# Patient Record
Sex: Female | Born: 1980 | Race: Black or African American | Hispanic: No | Marital: Single | Smoking: Current every day smoker
Health system: Southern US, Community
[De-identification: ages and names within clinical notes are randomized; demographics above are authoritative.]

## PROBLEM LIST (undated history)

## (undated) DIAGNOSIS — Z6841 Body Mass Index (BMI) 40.0 and over, adult: Secondary | ICD-10-CM

## (undated) DIAGNOSIS — I10 Essential (primary) hypertension: Secondary | ICD-10-CM

## (undated) DIAGNOSIS — Z86718 Personal history of other venous thrombosis and embolism: Secondary | ICD-10-CM

## (undated) DIAGNOSIS — K219 Gastro-esophageal reflux disease without esophagitis: Secondary | ICD-10-CM

## (undated) DIAGNOSIS — E669 Obesity, unspecified: Secondary | ICD-10-CM

## (undated) DIAGNOSIS — G4700 Insomnia, unspecified: Secondary | ICD-10-CM

## (undated) DIAGNOSIS — G4730 Sleep apnea, unspecified: Secondary | ICD-10-CM

## (undated) DIAGNOSIS — R296 Repeated falls: Secondary | ICD-10-CM

## (undated) DIAGNOSIS — G629 Polyneuropathy, unspecified: Secondary | ICD-10-CM

## (undated) DIAGNOSIS — G43909 Migraine, unspecified, not intractable, without status migrainosus: Secondary | ICD-10-CM

## (undated) DIAGNOSIS — F1010 Alcohol abuse, uncomplicated: Secondary | ICD-10-CM

## (undated) DIAGNOSIS — R32 Unspecified urinary incontinence: Secondary | ICD-10-CM

## (undated) DIAGNOSIS — F17218 Nicotine dependence, cigarettes, with other nicotine-induced disorders: Secondary | ICD-10-CM

## (undated) DIAGNOSIS — F32A Depression, unspecified: Secondary | ICD-10-CM

## (undated) DIAGNOSIS — R269 Unspecified abnormalities of gait and mobility: Secondary | ICD-10-CM

## (undated) DIAGNOSIS — R0609 Other forms of dyspnea: Secondary | ICD-10-CM

## (undated) HISTORY — DX: Unspecified urinary incontinence: R32

## (undated) HISTORY — DX: Repeated falls: R29.6

## (undated) HISTORY — DX: Other forms of dyspnea: R06.09

## (undated) HISTORY — DX: Alcohol abuse, uncomplicated: F10.10

## (undated) HISTORY — DX: Insomnia, unspecified: G47.00

## (undated) HISTORY — DX: Personal history of other venous thrombosis and embolism: Z86.718

## (undated) HISTORY — DX: Body Mass Index (BMI) 40.0 and over, adult: Z684

## (undated) HISTORY — DX: Nicotine dependence, cigarettes, with other nicotine-induced disorders: F17.218

## (undated) HISTORY — DX: Gastro-esophageal reflux disease without esophagitis: K21.9

## (undated) HISTORY — DX: Essential (primary) hypertension: I10

## (undated) HISTORY — DX: Unspecified abnormalities of gait and mobility: R26.9

## (undated) HISTORY — PX: FINGER FRACTURE SURGERY: SHX638

## (undated) HISTORY — DX: Sleep apnea, unspecified: G47.30

## (undated) HISTORY — DX: Depression, unspecified: F32.A

## (undated) HISTORY — DX: Morbid (severe) obesity due to excess calories: E66.01

---

## 1998-05-12 ENCOUNTER — Other Ambulatory Visit: Admission: RE | Admit: 1998-05-12 | Discharge: 1998-05-12 | Payer: Self-pay | Admitting: *Deleted

## 1998-05-20 ENCOUNTER — Ambulatory Visit (HOSPITAL_COMMUNITY): Admission: RE | Admit: 1998-05-20 | Discharge: 1998-05-20 | Payer: Self-pay | Admitting: Obstetrics

## 1998-06-11 ENCOUNTER — Ambulatory Visit (HOSPITAL_COMMUNITY): Admission: RE | Admit: 1998-06-11 | Discharge: 1998-06-11 | Payer: Self-pay

## 1998-08-07 ENCOUNTER — Inpatient Hospital Stay (HOSPITAL_COMMUNITY): Admission: AD | Admit: 1998-08-07 | Discharge: 1998-08-07 | Payer: Self-pay | Admitting: *Deleted

## 1998-08-08 ENCOUNTER — Inpatient Hospital Stay (HOSPITAL_COMMUNITY): Admission: AD | Admit: 1998-08-08 | Discharge: 1998-08-10 | Payer: Self-pay | Admitting: *Deleted

## 1999-04-07 ENCOUNTER — Emergency Department (HOSPITAL_COMMUNITY): Admission: EM | Admit: 1999-04-07 | Discharge: 1999-04-07 | Payer: Self-pay | Admitting: Emergency Medicine

## 2001-02-15 ENCOUNTER — Inpatient Hospital Stay (HOSPITAL_COMMUNITY): Admission: AD | Admit: 2001-02-15 | Discharge: 2001-02-15 | Payer: Self-pay | Admitting: Obstetrics & Gynecology

## 2001-12-18 ENCOUNTER — Emergency Department (HOSPITAL_COMMUNITY): Admission: EM | Admit: 2001-12-18 | Discharge: 2001-12-18 | Payer: Self-pay | Admitting: *Deleted

## 2001-12-18 ENCOUNTER — Encounter: Payer: Self-pay | Admitting: Emergency Medicine

## 2002-04-03 ENCOUNTER — Encounter: Payer: Self-pay | Admitting: Emergency Medicine

## 2002-04-03 ENCOUNTER — Emergency Department (HOSPITAL_COMMUNITY): Admission: EM | Admit: 2002-04-03 | Discharge: 2002-04-03 | Payer: Self-pay | Admitting: Emergency Medicine

## 2002-08-11 ENCOUNTER — Emergency Department (HOSPITAL_COMMUNITY): Admission: EM | Admit: 2002-08-11 | Discharge: 2002-08-11 | Payer: Self-pay | Admitting: Emergency Medicine

## 2003-02-06 ENCOUNTER — Emergency Department (HOSPITAL_COMMUNITY): Admission: EM | Admit: 2003-02-06 | Discharge: 2003-02-06 | Payer: Self-pay | Admitting: *Deleted

## 2003-03-10 ENCOUNTER — Encounter: Admission: RE | Admit: 2003-03-10 | Discharge: 2003-03-10 | Payer: Self-pay | Admitting: Obstetrics & Gynecology

## 2003-03-10 ENCOUNTER — Encounter: Payer: Self-pay | Admitting: Obstetrics & Gynecology

## 2005-04-09 ENCOUNTER — Emergency Department (HOSPITAL_COMMUNITY): Admission: EM | Admit: 2005-04-09 | Discharge: 2005-04-09 | Payer: Self-pay | Admitting: Emergency Medicine

## 2005-05-19 ENCOUNTER — Emergency Department (HOSPITAL_COMMUNITY): Admission: EM | Admit: 2005-05-19 | Discharge: 2005-05-19 | Payer: Self-pay | Admitting: Emergency Medicine

## 2006-11-02 ENCOUNTER — Ambulatory Visit (HOSPITAL_COMMUNITY): Admission: RE | Admit: 2006-11-02 | Discharge: 2006-11-02 | Payer: Self-pay | Admitting: Obstetrics

## 2006-12-18 HISTORY — PX: ESSURE TUBAL LIGATION: SUR464

## 2006-12-19 ENCOUNTER — Ambulatory Visit (HOSPITAL_COMMUNITY): Admission: RE | Admit: 2006-12-19 | Discharge: 2006-12-19 | Payer: Self-pay | Admitting: Obstetrics

## 2007-01-09 ENCOUNTER — Ambulatory Visit (HOSPITAL_COMMUNITY): Admission: RE | Admit: 2007-01-09 | Discharge: 2007-01-09 | Payer: Self-pay | Admitting: Obstetrics

## 2007-03-21 ENCOUNTER — Ambulatory Visit (HOSPITAL_COMMUNITY): Admission: RE | Admit: 2007-03-21 | Discharge: 2007-03-21 | Payer: Self-pay | Admitting: Obstetrics

## 2007-05-20 ENCOUNTER — Inpatient Hospital Stay (HOSPITAL_COMMUNITY): Admission: RE | Admit: 2007-05-20 | Discharge: 2007-05-22 | Payer: Self-pay | Admitting: Obstetrics

## 2007-10-20 ENCOUNTER — Emergency Department (HOSPITAL_COMMUNITY): Admission: EM | Admit: 2007-10-20 | Discharge: 2007-10-20 | Payer: Self-pay | Admitting: Family Medicine

## 2008-02-18 ENCOUNTER — Ambulatory Visit (HOSPITAL_COMMUNITY): Admission: RE | Admit: 2008-02-18 | Discharge: 2008-02-18 | Payer: Self-pay | Admitting: Obstetrics & Gynecology

## 2009-05-19 ENCOUNTER — Emergency Department (HOSPITAL_COMMUNITY): Admission: EM | Admit: 2009-05-19 | Discharge: 2009-05-19 | Payer: Self-pay | Admitting: Family Medicine

## 2011-01-09 ENCOUNTER — Encounter: Payer: Self-pay | Admitting: Obstetrics & Gynecology

## 2011-05-02 NOTE — Op Note (Signed)
NAME:  Sonya Hurst, Sonya Hurst             ACCOUNT NO.:  [id number]   MEDICAL RECORD NO.:  [id number]          PATIENT TYPE:  AMB   LOCATION:  SDC                           FACILITY:  [facility]   PHYSICIAN:  Roseanna Rainbow, M.D.DATE OF BIRTH:  1981/08/24   DATE OF PROCEDURE:  02/18/2008  DATE OF DISCHARGE:                               OPERATIVE REPORT   PREOPERATIVE DIAGNOSIS:  Desires sterilization procedure.   POSTOPERATIVE DIAGNOSIS:  Desires sterilization procedure.   PROCEDURE:  Essure placement.   SURGEON:  Antionette Char, M. D.   ANESTHESIA:  Laryngeal mask airway.   PATHOLOGY:  Not applicable.   ESTIMATED BLOOD LOSS:  Minimal.   COMPLICATIONS:  None.   IV FLUIDS:  As per anesthesiology.   URINE OUTPUT:  200 mL clear urine at the beginning of the procedure.   PROCEDURE:  The patient was taken to the operating room with an IV  running.  A laryngeal mask airway was placed without difficulty.  She  was placed in the dorsal lithotomy position and prepped and draped in  the usual sterile fashion.  After a time-out had been completed a Graves  speculum was then placed in the patient's vagina.  The anterior lip of  the cervix was then grasped with a single-tooth tenaculum.  The cervix  was then dilated with [facility] dilators.  The hysteroscope was then  advanced into the uterus and the tubal ostia were visualized by a  bilaterally.  The left tube was cannulated first and Essure was then  placed using the introducer.  There were seven coils distal to the tubal  ostia after placement.  The procedure was repeated on the right and two  coils were noted distal from the tubal ostia after insertion.  The  hysteroscope was then removed, the single-tooth tenaculum was then  removed with minimal bleeding noted from the cervix.  At the close of  procedure the instrument and pack counts were said to be correct x2.  The patient was taken to the PACU awake and in stable condition.      Roseanna Rainbow, M.D.  Electronically Signed     LAJ/MEDQ  D:  02/18/2008  T:  02/18/2008  Job:  [id number]

## 2011-09-11 LAB — CBC
HCT: 40.7
Hemoglobin: 13.5
MCHC: 33.2
RDW: 14.1

## 2011-10-05 LAB — CBC
HCT: 33.4 — ABNORMAL LOW
Hemoglobin: 11.2 — ABNORMAL LOW
Hemoglobin: 12.4
MCHC: 33.6
MCHC: 34.1
MCV: 90.3
MCV: 91.2
RBC: 3.66 — ABNORMAL LOW
RBC: 4.02
RDW: 14.2 — ABNORMAL HIGH
RDW: 14.4 — ABNORMAL HIGH

## 2011-10-05 LAB — RPR: RPR Ser Ql: NONREACTIVE

## 2014-01-30 ENCOUNTER — Encounter (HOSPITAL_COMMUNITY): Payer: Self-pay | Admitting: Emergency Medicine

## 2014-01-30 ENCOUNTER — Emergency Department (INDEPENDENT_AMBULATORY_CARE_PROVIDER_SITE_OTHER): Payer: 59

## 2014-01-30 ENCOUNTER — Emergency Department (HOSPITAL_COMMUNITY)
Admission: EM | Admit: 2014-01-30 | Discharge: 2014-01-30 | Disposition: A | Discharge status: Home / Self Care | Payer: 59 | Source: Home / Self Care

## 2014-01-30 DIAGNOSIS — S62609A Fracture of unspecified phalanx of unspecified finger, initial encounter for closed fracture: Secondary | ICD-10-CM

## 2014-01-30 MED ORDER — TRAMADOL HCL 50 MG PO TABS: 50.0000 mg | ORAL_TABLET | Freq: Four times a day (QID) | ORAL | Status: DC | PRN

## 2014-01-30 NOTE — Discharge Instructions (Signed)
Thank you for coming in today. Keep the finger splinted.  Use 2 aleve twice daily for pain as needed.  Use tramadol for severe pain.  Follow up with your hand surgeon.   Finger Fracture Fractures of fingers are breaks in the bones of the fingers. There are many types of fractures. There are different ways of treating these fractures. Your health care provider will discuss the best way to treat your fracture. CAUSES Traumatic injury is the main cause of broken fingers. These include:  Injuries while playing sports.  Workplace injuries.  Falls. RISK FACTORS Activities that can increase your risk of finger fractures include:  Sports.  Workplace activities that involve machinery.  A condition called osteoporosis, which can make your bones less dense and cause them to fracture more easily. SIGNS AND SYMPTOMS The main symptoms of a broken finger are pain and swelling within 15 minutes after the injury. Other symptoms include:  Bruising of your finger.  Stiffness of your finger.  Numbness of your finger.  Exposed bones (compound fracture) if the fracture is severe. DIAGNOSIS  The best way to diagnose a broken bone is with X-ray imaging. Additionally, your health care provider will use this X-ray image to evaluate the position of the broken finger bones.  TREATMENT  Finger fractures can be treated with:   Nonreduction This means the bones are in place. The finger is splinted without changing the positions of the bone pieces. The splint is usually left on for about a week to 10 days. This will depend on your fracture and what your health care provider thinks.  Closed reduction The bones are put back into position without using surgery. The finger is then splinted.  Open reduction and internal fixation The fracture site is opened. Then the bone pieces are fixed into place with pins or some type of hardware. This is seldom required. It depends on the severity of the fracture. HOME  CARE INSTRUCTIONS   Follow your health care provider's instructions regarding activities, exercises, and physical therapy.  Only take over-the-counter or prescription medicines for pain, discomfort, or fever as directed by your health care provider. SEEK MEDICAL CARE IF: You have pain or swelling that limits the motion or use of your fingers. SEEK IMMEDIATE MEDICAL CARE IF:  Your finger becomes numb. MAKE SURE YOU:   Understand these instructions.  Will watch your condition.  Will get help right away if you are not doing well or get worse. Document Released: 03/18/2001 Document Revised: 09/24/2013 Document Reviewed: 07/16/2013 [facility] Patient Information 2014 [place], [place].
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
## 2015-04-12 ENCOUNTER — Encounter (HOSPITAL_COMMUNITY): Payer: Self-pay | Admitting: *Deleted

## 2015-04-12 ENCOUNTER — Emergency Department (HOSPITAL_COMMUNITY)
Admission: EM | Admit: 2015-04-12 | Discharge: 2015-04-12 | Discharge status: Against Medical Advice | Payer: PRIVATE HEALTH INSURANCE | Attending: Emergency Medicine | Admitting: Emergency Medicine

## 2015-04-12 ENCOUNTER — Encounter (HOSPITAL_COMMUNITY): Payer: Self-pay | Admitting: Emergency Medicine

## 2015-04-12 ENCOUNTER — Emergency Department (INDEPENDENT_AMBULATORY_CARE_PROVIDER_SITE_OTHER)
Admission: EM | Admit: 2015-04-12 | Discharge: 2015-04-12 | Disposition: A | Discharge status: Nursing Home (Includes ICF) | Payer: PRIVATE HEALTH INSURANCE | Source: Home / Self Care | Attending: Family Medicine | Admitting: Family Medicine

## 2015-04-12 ENCOUNTER — Emergency Department (INDEPENDENT_AMBULATORY_CARE_PROVIDER_SITE_OTHER): Payer: PRIVATE HEALTH INSURANCE

## 2015-04-12 DIAGNOSIS — E669 Obesity, unspecified: Secondary | ICD-10-CM | POA: Diagnosis not present

## 2015-04-12 DIAGNOSIS — R2240 Localized swelling, mass and lump, unspecified lower limb: Secondary | ICD-10-CM | POA: Diagnosis present

## 2015-04-12 DIAGNOSIS — R0602 Shortness of breath: Secondary | ICD-10-CM | POA: Diagnosis not present

## 2015-04-12 DIAGNOSIS — Z720 Tobacco use: Secondary | ICD-10-CM | POA: Insufficient documentation

## 2015-04-12 HISTORY — DX: Obesity, unspecified: E66.9

## 2015-04-12 HISTORY — DX: Migraine, unspecified, not intractable, without status migrainosus: G43.909

## 2015-04-12 LAB — POCT I-STAT, CHEM 8
CHLORIDE: 105 mmol/L (ref 96–112)
Calcium, Ion: 1.1 mmol/L — ABNORMAL LOW (ref 1.12–1.23)
Creatinine, Ser: 0.8 mg/dL (ref 0.50–1.10)
Glucose, Bld: 97 mg/dL (ref 70–99)
HCT: 47 % — ABNORMAL HIGH (ref 36.0–46.0)
Hemoglobin: 16 g/dL — ABNORMAL HIGH (ref 12.0–15.0)
Potassium: 3.4 mmol/L — ABNORMAL LOW (ref 3.5–5.1)
SODIUM: 140 mmol/L (ref 135–145)
TCO2: 19 mmol/L (ref 0–100)

## 2015-04-12 LAB — POCT URINALYSIS DIP (DEVICE)
Glucose, UA: NEGATIVE mg/dL
LEUKOCYTES UA: NEGATIVE
Nitrite: NEGATIVE
PROTEIN: 30 mg/dL — AB
Specific Gravity, Urine: >=1.03 (ref 1.005–1.030)
UROBILINOGEN UA: 1 mg/dL (ref 0.0–1.0)
pH: 5 (ref 5.0–8.0)

## 2015-04-12 NOTE — ED Provider Notes (Addendum)
CSN: [id number]     Arrival date & time 04/12/15  1330 History   First MD Initiated Contact with Patient 04/12/15 1403     Chief Complaint  Patient presents with  . Leg Swelling  . Shortness of Breath  . Galactorrhea   (Consider location/radiation/quality/duration/timing/severity/associated sxs/prior Treatment) Patient is a 34 y.o. female presenting with shortness of breath. The history is provided by the patient.  Shortness of Breath Severity:  Moderate Onset quality:  Gradual Duration:  2 days Progression:  Unchanged Chronicity:  New Context: activity   Relieved by:  None tried Worsened by:  Nothing tried Ineffective treatments:  None tried Associated symptoms: no chest pain and no fever   Risk factors: alcohol use, obesity and tobacco use     Past Medical History  Diagnosis Date  . Migraines    History reviewed. No pertinent past surgical history. History reviewed. No pertinent family history. History  Substance Use Topics  . Smoking status: Never Smoker   . Smokeless tobacco: Not on file  . Alcohol Use: Yes   OB History    No data available     Review of Systems  Constitutional: Negative.  Negative for fever.  HENT: Negative.   Respiratory: Positive for shortness of breath.   Cardiovascular: Positive for leg swelling. Negative for chest pain and palpitations.    Allergies  Review of patient's allergies indicates no known allergies.  Home Medications   Prior to Admission medications   Medication Sig Start Date End Date Taking? Authorizing Provider  traMADol (ULTRAM) 50 MG tablet Take 1 tablet (50 mg total) by mouth every 6 (six) hours as needed. 01/30/14   Rodolph BongEvan S Corey, MD   BP 133/86 mmHg  Pulse 112  Temp(Src) 99.4 F (37.4 C) (Oral)  Resp 16  SpO2 97% Physical Exam  Constitutional: She is oriented to person, place, and time. She appears well-developed and well-nourished. She is cooperative.  HENT:  Head: Normocephalic.  Mouth/Throat: Oropharynx  is clear and moist.  Eyes: Conjunctivae and EOM are normal. Pupils are equal, round, and reactive to light.  Neck: Normal range of motion. Neck supple.  Cardiovascular: Regular rhythm, normal heart sounds and intact distal pulses.  Tachycardia present.   No murmur heard. Pulmonary/Chest: Effort normal and breath sounds normal.  Abdominal: Soft. Bowel sounds are normal. She exhibits no mass. There is no tenderness.  Musculoskeletal: She exhibits edema. She exhibits no tenderness.  Lymphadenopathy:    She has no cervical adenopathy.  Neurological: She is alert and oriented to person, place, and time.  Nursing note and vitals reviewed.   ED Course  Procedures (including critical care time) Labs Review Labs Reviewed  POCT URINALYSIS DIP (DEVICE) - Abnormal; Notable for the following:    Bilirubin Urine SMALL (*)    Ketones, ur TRACE (*)    Hgb urine dipstick SMALL (*)    Protein, ur 30 (*)    All other components within normal limits  POCT I-STAT, CHEM 8 - Abnormal; Notable for the following:    Potassium 3.4 (*)    BUN <3 (*)    Calcium, Ion 1.10 (*)    Hemoglobin 16.0 (*)    HCT 47.0 (*)    All other components within normal limits    Imaging Review Dg Chest 2 View  04/12/2015   CLINICAL DATA:  Shortness of Breath  EXAM: CHEST  2 VIEW  COMPARISON:  May 19, 2005  FINDINGS: Lungs are clear. Heart size and pulmonary vascularity are  normal. No adenopathy. No bone lesions.  IMPRESSION: No edema or consolidation.   Electronically Signed   By: Bretta Bang III M.D.   On: 04/12/2015 14:44     MDM   1. SOB (shortness of breath) on exertion    Sent for eval of sob, tachycardia, doe, edema unclear etiol. Alcohol abuse.    Linna Hoff, MD 04/12/15 1516  Linna Hoff, MD 04/12/15 [phone]

## 2015-04-12 NOTE — ED Notes (Addendum)
Pt states she has been suffering from BLE swelling for about two weeks.  She also reports SOB with any activity and when lying down, and numbness in BUE.  Pt also reports urinary leakage. Pt admits to about a bottle of alcohol a day, depending on how angry her job makes her.

## 2015-04-12 NOTE — ED Notes (Signed)
Pt sent here from ucc. Reports leg swelling x 2 weeks. ucc reported sob with exertion, pt states that is normal for her. No resp distress noted at triage.

## 2015-04-12 NOTE — ED Notes (Signed)
Pt being transferred to ED via shuttle.  Report was given to the ED First Nurse.

## 2015-08-26 ENCOUNTER — Encounter (HOSPITAL_COMMUNITY): Payer: Self-pay | Admitting: Emergency Medicine

## 2015-08-26 ENCOUNTER — Encounter (HOSPITAL_COMMUNITY): Payer: Self-pay | Admitting: *Deleted

## 2015-08-26 ENCOUNTER — Emergency Department (HOSPITAL_COMMUNITY)
Admission: EM | Admit: 2015-08-26 | Discharge: 2015-08-26 | Disposition: A | Discharge status: Home / Self Care | Payer: No Typology Code available for payment source | Attending: Emergency Medicine | Admitting: Emergency Medicine

## 2015-08-26 DIAGNOSIS — L03311 Cellulitis of abdominal wall: Secondary | ICD-10-CM | POA: Insufficient documentation

## 2015-08-26 DIAGNOSIS — E669 Obesity, unspecified: Secondary | ICD-10-CM | POA: Diagnosis not present

## 2015-08-26 DIAGNOSIS — Z8679 Personal history of other diseases of the circulatory system: Secondary | ICD-10-CM | POA: Insufficient documentation

## 2015-08-26 DIAGNOSIS — R21 Rash and other nonspecific skin eruption: Secondary | ICD-10-CM | POA: Diagnosis present

## 2015-08-26 DIAGNOSIS — Z720 Tobacco use: Secondary | ICD-10-CM | POA: Insufficient documentation

## 2015-08-26 DIAGNOSIS — R000 Tachycardia, unspecified: Secondary | ICD-10-CM | POA: Diagnosis not present

## 2015-08-26 LAB — CBC WITH DIFFERENTIAL/PLATELET
BASOS ABS: 0 10*3/uL (ref 0.0–0.1)
BASOS PCT: 0 % (ref 0–1)
EOS ABS: 0.1 10*3/uL (ref 0.0–0.7)
Eosinophils Relative: 0 % (ref 0–5)
HCT: 39.3 % (ref 36.0–46.0)
HEMOGLOBIN: 13.3 g/dL (ref 12.0–15.0)
Lymphocytes Relative: 14 % (ref 12–46)
Lymphs Abs: 2.1 10*3/uL (ref 0.7–4.0)
MCH: 33.7 pg (ref 26.0–34.0)
MCHC: 33.8 g/dL (ref 30.0–36.0)
MCV: 99.5 fL (ref 78.0–100.0)
Monocytes Absolute: 1 10*3/uL (ref 0.1–1.0)
Monocytes Relative: 7 % (ref 3–12)
NEUTROS PCT: 79 % — AB (ref 43–77)
Neutro Abs: 11.5 10*3/uL — ABNORMAL HIGH (ref 1.7–7.7)
Platelets: 164 10*3/uL (ref 150–400)
RBC: 3.95 MIL/uL (ref 3.87–5.11)
RDW: 13.7 % (ref 11.5–15.5)
WBC: 14.6 10*3/uL — AB (ref 4.0–10.5)

## 2015-08-26 LAB — COMPREHENSIVE METABOLIC PANEL
ALBUMIN: 2.9 g/dL — AB (ref 3.5–5.0)
ALK PHOS: 72 U/L (ref 38–126)
ALT: 17 U/L (ref 14–54)
ANION GAP: 7 (ref 5–15)
AST: 23 U/L (ref 15–41)
BUN: <5 mg/dL — ABNORMAL LOW (ref 6–20)
CALCIUM: 8.3 mg/dL — AB (ref 8.9–10.3)
CO2: 22 mmol/L (ref 22–32)
Chloride: 108 mmol/L (ref 101–111)
Creatinine, Ser: 0.92 mg/dL (ref 0.44–1.00)
GFR calc Af Amer: >60 mL/min (ref >60)
GFR calc non Af Amer: >60 mL/min (ref >60)
GLUCOSE: 98 mg/dL (ref 65–99)
Potassium: 3.6 mmol/L (ref 3.5–5.1)
SODIUM: 137 mmol/L (ref 135–145)
Total Bilirubin: 0.9 mg/dL (ref 0.3–1.2)
Total Protein: 6.4 g/dL — ABNORMAL LOW (ref 6.5–8.1)

## 2015-08-26 LAB — POC URINE PREG, ED: Preg Test, Ur: NEGATIVE

## 2015-08-26 MED ORDER — VANCOMYCIN HCL IN DEXTROSE 1-5 GM/200ML-% IV SOLN
1000.0000 mg | Freq: Once | INTRAVENOUS | Status: AC
  Administered 2015-08-26: 1000 mg via INTRAVENOUS
  Filled 2015-08-26: qty 200

## 2015-08-26 NOTE — ED Notes (Signed)
Patient brought back to room with family in tow; patient getting undressed and into a gown a this time; visitor at bedside; Amy, RN aware

## 2015-08-26 NOTE — ED Notes (Signed)
Patient states possible abscess to abdomen.   Patient states pain and redness around area.

## 2015-08-26 NOTE — ED Notes (Signed)
CT called and needs a pregnancy test before they can do CT.

## 2015-08-26 NOTE — ED Notes (Addendum)
Pt states she was here this morning and was supposed to get a CT scan but did not want to wait and has now come back for the CT scan. diagnosed with abd cellulitis.

## 2015-08-26 NOTE — ED Provider Notes (Signed)
CSN: [id number]     Arrival date & time 08/26/15  5621 History   First MD Initiated Contact with Patient 08/26/15 [phone]     Chief Complaint  Patient presents with  . Abscess     (Consider location/radiation/quality/duration/timing/severity/associated sxs/prior Treatment) HPI Comments: Pt is a 34 yo female who presents to the ED with complaint of abdominal abscess, onset 2 days. Pt reports she noticed a small bump on her epigastric region that has worsened and become more painful. Endorses warmth and swelling to the area and notes she has had diarrhea for the past 2 days. Denies fever, drainage, N/V, urinary sxs, blood in stool or urine. Pt reports she has tried OTC boil ointment and ibuprofen without relief. Endorses history of abscesses on her back.   Patient is a 34 y.o. female presenting with abscess.  Abscess Associated symptoms: no fever, no nausea and no vomiting     Past Medical History  Diagnosis Date  . Migraines   . Obesity    History reviewed. No pertinent past surgical history. No family history on file. Social History  Substance Use Topics  . Smoking status: Current Every Day Smoker -- 0.50 packs/day    Types: Cigarettes  . Smokeless tobacco: None  . Alcohol Use: Yes     Comment: Pt admits to a fifth of liquor a day   OB History    No data available     Review of Systems  Constitutional: Negative for fever.  Respiratory: Negative for shortness of breath.   Cardiovascular: Negative for chest pain.  Gastrointestinal: Positive for abdominal pain and diarrhea. Negative for nausea and vomiting.  Genitourinary: Negative for dysuria, frequency and hematuria.  Skin: Positive for rash.      Allergies  Review of patient's allergies indicates no known allergies.  Home Medications   Prior to Admission medications   Medication Sig Start Date End Date Taking? Authorizing Provider  ibuprofen (ADVIL,MOTRIN) 200 MG tablet Take 200 mg by mouth every 6 (six) hours as  needed for mild pain.   Yes Historical Provider, MD   BP 119/79 mmHg  Pulse 108  Temp(Src) 98.4 F (36.9 C) (Oral)  Resp 18  Ht  (1.626 m)  Wt 340 lb (154.223 kg)  BMI 58.33 kg/m2  SpO2 100% Physical Exam  Constitutional: She is oriented to person, place, and time. She appears well-developed and well-nourished.  HENT:  Head: Normocephalic and atraumatic.  Eyes: Conjunctivae and EOM are normal. Right eye exhibits no discharge. Left eye exhibits no discharge. No scleral icterus.  Pulmonary/Chest: Effort normal.  Abdominal: Soft. Bowel sounds are normal. She exhibits no mass. There is tenderness. There is no rebound and no guarding.  Neurological: She is alert and oriented to person, place, and time.  Skin: Skin is warm and dry.  8x8cm area of erythema, warmth noted to epigastric region with mild induration, no fluctuance, no drainage.   Nursing note and vitals reviewed.   ED Course  Procedures (including critical care time) Labs Review Labs Reviewed - No data to display  Imaging Review No results found. I have personally reviewed and evaluated these images and lab results as part of my medical decision-making.  Filed Vitals:   08/26/15 1117  BP: 129/70  Pulse: 96  Temp:   Resp: 18   Meds given in ED:  Medications  vancomycin (VANCOCIN) IVPB 1000 mg/200 mL premix (0 mg Intravenous Stopped 08/26/15 1101)    New Prescriptions   No medications on file  MDM   Final diagnoses:  Cellulitis of abdominal wall    Pt presents with swelling, redness and warmth to epigastric region. No drainage. No fever, VSS. 8x8cm area of warmth, erythema and mild induration noted.   Pt given 1g vanc. WBC 14.6, CMP unremarkable. Plan to get CT abdomen pelvis. Nurse advised me that pt reported she wants to go home and does not want to stay for the CT. I went to speak with pt and discussed our recommendation for having the CT done in order to evaluate how extensive her infection is.  Pt was explained the risk of leaving and verbalized understanding but continued to state that she wanted to leave so she could pick up her son but would come back. Pt left AMA.    Satira Sark [place], [place] 08/26/15 1226  Bethann Berkshire, MD 08/27/15 [phone]

## 2015-08-26 NOTE — ED Notes (Signed)
Patient reports that she wants to go home and she does not want to stay for the CT. Patient explained the risk of leaving and the reasons to return immediately to the hospital. Patient verbalized understanding.

## 2015-08-27 ENCOUNTER — Emergency Department (HOSPITAL_COMMUNITY): Payer: No Typology Code available for payment source

## 2015-08-27 ENCOUNTER — Encounter (HOSPITAL_COMMUNITY): Payer: Self-pay

## 2015-08-27 ENCOUNTER — Emergency Department (HOSPITAL_COMMUNITY)
Admission: EM | Admit: 2015-08-27 | Discharge: 2015-08-27 | Disposition: A | Discharge status: Home / Self Care | Payer: No Typology Code available for payment source | Source: Home / Self Care | Attending: Emergency Medicine | Admitting: Emergency Medicine

## 2015-08-27 DIAGNOSIS — L03311 Cellulitis of abdominal wall: Secondary | ICD-10-CM

## 2015-08-27 LAB — CBC WITH DIFFERENTIAL/PLATELET
Basophils Absolute: 0 10*3/uL (ref 0.0–0.1)
Basophils Relative: 0 % (ref 0–1)
EOS PCT: 0 % (ref 0–5)
Eosinophils Absolute: 0.1 10*3/uL (ref 0.0–0.7)
HCT: 39.3 % (ref 36.0–46.0)
Hemoglobin: 13.4 g/dL (ref 12.0–15.0)
LYMPHS ABS: 2.8 10*3/uL (ref 0.7–4.0)
LYMPHS PCT: 18 % (ref 12–46)
MCH: 33.8 pg (ref 26.0–34.0)
MCHC: 34.1 g/dL (ref 30.0–36.0)
MCV: 99.2 fL (ref 78.0–100.0)
MONO ABS: 0.8 10*3/uL (ref 0.1–1.0)
MONOS PCT: 5 % (ref 3–12)
Neutro Abs: 12.3 10*3/uL — ABNORMAL HIGH (ref 1.7–7.7)
Neutrophils Relative %: 77 % (ref 43–77)
PLATELETS: 154 10*3/uL (ref 150–400)
RBC: 3.96 MIL/uL (ref 3.87–5.11)
RDW: 13.7 % (ref 11.5–15.5)
WBC: 16 10*3/uL — AB (ref 4.0–10.5)

## 2015-08-27 LAB — BASIC METABOLIC PANEL
Anion gap: 9 (ref 5–15)
CO2: 22 mmol/L (ref 22–32)
Calcium: 8.3 mg/dL — ABNORMAL LOW (ref 8.9–10.3)
Chloride: 105 mmol/L (ref 101–111)
Creatinine, Ser: 0.8 mg/dL (ref 0.44–1.00)
GFR calc Af Amer: >60 mL/min (ref >60)
GLUCOSE: 96 mg/dL (ref 65–99)
POTASSIUM: 3.2 mmol/L — AB (ref 3.5–5.1)
Sodium: 136 mmol/L (ref 135–145)

## 2015-08-27 MED ORDER — IBUPROFEN 200 MG PO TABS
600.0000 mg | ORAL_TABLET | Freq: Once | ORAL | Status: AC
  Administered 2015-08-27: 600 mg via ORAL
  Filled 2015-08-27: qty 3

## 2015-08-27 MED ORDER — DOXYCYCLINE HYCLATE 100 MG PO TABS
100.0000 mg | ORAL_TABLET | Freq: Once | ORAL | Status: AC
  Administered 2015-08-27: 100 mg via ORAL
  Filled 2015-08-27: qty 1

## 2015-08-27 MED ORDER — IOHEXOL 300 MG/ML  SOLN: 25.0000 mL | Freq: Once | INTRAMUSCULAR | Status: DC | PRN

## 2015-08-27 MED ORDER — DOXYCYCLINE HYCLATE 100 MG PO CAPS: 100.0000 mg | ORAL_CAPSULE | Freq: Two times a day (BID) | ORAL | Status: DC

## 2015-08-27 MED ORDER — SODIUM CHLORIDE 0.9 % IV BOLUS (SEPSIS)
2000.0000 mL | Freq: Once | INTRAVENOUS | Status: AC
  Administered 2015-08-27: 2000 mL via INTRAVENOUS

## 2015-08-27 MED ORDER — CEPHALEXIN 250 MG PO CAPS
500.0000 mg | ORAL_CAPSULE | Freq: Once | ORAL | Status: AC
  Administered 2015-08-27: 500 mg via ORAL
  Filled 2015-08-27: qty 2

## 2015-08-27 MED ORDER — SODIUM CHLORIDE 0.9 % IV BOLUS (SEPSIS)
1000.0000 mL | Freq: Once | INTRAVENOUS | Status: AC
  Administered 2015-08-27: 1000 mL via INTRAVENOUS

## 2015-08-27 MED ORDER — IOHEXOL 300 MG/ML  SOLN
100.0000 mL | Freq: Once | INTRAMUSCULAR | Status: AC | PRN
  Administered 2015-08-27: 100 mL via INTRAVENOUS

## 2015-08-27 MED ORDER — CEPHALEXIN 500 MG PO CAPS: 500.0000 mg | ORAL_CAPSULE | Freq: Two times a day (BID) | ORAL | Status: DC

## 2015-08-27 NOTE — Discharge Instructions (Signed)
Cellulitis Ms. Sonya Hurst, take antibiotics as directed for your infection. See primary care physician within 3 days for wound check. If you cannot get an appointment come back to the emergency department. For any worsening come back to emergency department as soon as possible. Thank you. Cellulitis is an infection of the skin and the tissue under the skin. The infected area is usually red and tender. This happens most often in the arms and lower legs. HOME CARE   Take your antibiotic medicine as told. Finish the medicine even if you start to feel better.  Keep the infected arm or leg raised (elevated).  Put a warm cloth on the area up to 4 times per day.  Only take medicines as told by your doctor.  Keep all doctor visits as told. GET HELP IF:  You see red streaks on the skin coming from the infected area.  Your red area gets bigger or turns a dark color.  Your bone or joint under the infected area is painful after the skin heals.  Your infection comes back in the same area or different area.  You have a puffy (swollen) bump in the infected area.  You have new symptoms.  You have a fever. GET HELP RIGHT AWAY IF:   You feel very sleepy.  You throw up (vomit) or have watery poop (diarrhea).  You feel sick and have muscle aches and pains. MAKE SURE YOU:   Understand these instructions.  Will watch your condition.  Will get help right away if you are not doing well or get worse. Document Released: 05/22/2008 Document Revised: 04/20/2014 Document Reviewed: 02/19/2012 [facility] Patient Information 2015 [place], [place]. This information is not intended to replace advice given to you by your health care provider. Make sure you discuss any questions you have with your health care provider.

## 2015-08-27 NOTE — ED Notes (Signed)
Dr. Mora Bellman at bedside with [place].

## 2015-08-27 NOTE — ED Notes (Signed)
Pt verbalized understanding of d/c instructions and has no further questions. Pt stable and NAD.  

## 2015-08-27 NOTE — ED Provider Notes (Signed)
CSN: [id number]     Arrival date & time 08/26/15  1918 History  This chart was scribed for Tomasita Crumble, MD by Evon Slack, ED Scribe. This patient was seen in room A06C/A06C and the patient's care was started at 12:41 AM.      Chief Complaint  Patient presents with  . CT scan    Patient is a 34 y.o. female presenting with rash. The history is provided by the patient. No language interpreter was used.  Rash Location:  Torso Torso rash location: abdomen. Quality: redness   Severity:  Mild Onset quality:  Gradual Duration:  2 days Timing:  Constant Chronicity:  New Associated symptoms: abdominal pain   Associated symptoms: no fever, no nausea and not vomiting    HPI Comments: Sonya Hurst is a 34 y.o. female who presents to the Emergency Department complaining of painful rash to her upper abdomen. Pt states that she was here in the ED earlier today but states that she was tired of waiting and has returned for a CT scan. She states that she was with abdominal cellulitis prior to leaving. Denies fever, diaphoresis, or drainage from the area.    Past Medical History  Diagnosis Date  . Migraines   . Obesity    History reviewed. No pertinent past surgical history. No family history on file. Social History  Substance Use Topics  . Smoking status: Current Every Day Smoker -- 0.50 packs/day    Types: Cigarettes  . Smokeless tobacco: None  . Alcohol Use: Yes     Comment: Pt admits to a fifth of liquor a day   OB History    No data available     Review of Systems  Constitutional: Negative for fever.  Gastrointestinal: Positive for abdominal pain. Negative for nausea and vomiting.  Skin: Positive for rash.  All other systems reviewed and are negative.    Allergies  Review of patient's allergies indicates no known allergies.  Home Medications   Prior to Admission medications   Medication Sig Start Date End Date Taking? Authorizing Provider  ibuprofen (ADVIL,MOTRIN)  200 MG tablet Take 200 mg by mouth every 6 (six) hours as needed for mild pain.    Historical Provider, MD   BP 127/82 mmHg  Pulse 120  Temp(Src) 98.9 F (37.2 C) (Oral)  Resp 18  Ht  (1.626 m)  Wt 289 lb (131.09 kg)  BMI 49.58 kg/m2  SpO2 97%   Physical Exam  Constitutional: She is oriented to person, place, and time. She appears well-developed and well-nourished. No distress.  HENT:  Head: Normocephalic and atraumatic.  Nose: Nose normal.  Mouth/Throat: Oropharynx is clear and moist. No oropharyngeal exudate.  Eyes: Conjunctivae and EOM are normal. Pupils are equal, round, and reactive to light. No scleral icterus.  Neck: Normal range of motion. Neck supple. No JVD present. No tracheal deviation present. No thyromegaly present.  Cardiovascular: Regular rhythm and normal heart sounds.  Tachycardia present.  Exam reveals no gallop and no friction rub.   No murmur heard. Pulmonary/Chest: Effort normal and breath sounds normal. No respiratory distress. She has no wheezes. She exhibits no tenderness.  Abdominal: Soft. Bowel sounds are normal. She exhibits no distension and no mass. There is tenderness. There is no rebound and no guarding.  7 CM circumferntial area of cellitus with warmth and erythema on skin of mid epigastric. .5cm area that comes to an head with no drainage but is TTP.   Musculoskeletal: Normal range of  motion. She exhibits no edema or tenderness.  Lymphadenopathy:    She has no cervical adenopathy.  Neurological: She is alert and oriented to person, place, and time. No cranial nerve deficit. She exhibits normal muscle tone.  Skin: Skin is warm and dry. No rash noted. No erythema. No pallor.  Nursing note and vitals reviewed.   ED Course  Procedures (including critical care time) DIAGNOSTIC STUDIES: Oxygen Saturation is 97% on RA, normal by my interpretation.    COORDINATION OF CARE: 1:02 AM-Discussed treatment plan with pt at bedside and pt agreed to plan.      Labs Review Labs Reviewed  CBC WITH DIFFERENTIAL/PLATELET - Abnormal; Notable for the following:    WBC 16.0 (*)    Neutro Abs 12.3 (*)    All other components within normal limits  BASIC METABOLIC PANEL - Abnormal; Notable for the following:    Potassium 3.2 (*)    BUN <5 (*)    Calcium 8.3 (*)    All other components within normal limits    Imaging Review Ct Abdomen Pelvis W Contrast  08/27/2015   CLINICAL DATA:  Patient noted a raised area in the mid abdomen 2 days ago. This is growing in size and becoming painful.  EXAM: CT ABDOMEN AND PELVIS WITH CONTRAST  TECHNIQUE: Multidetector CT imaging of the abdomen and pelvis was performed using the standard protocol following bolus administration of intravenous contrast.  CONTRAST:  OMNIPAQUE IOHEXOL 300 MG/ML SOLN, 1 OMNIPAQUE IOHEXOL 300 MG/ML SOLN  COMPARISON:  None.  FINDINGS: Mild dependent changes as well as motion artifact in the lung bases.  There is diffuse infiltration in the subcutaneous fat over the anterior abdominal wall along the midline. This is consistent with cellulitis or contusion. No discrete fluid collection to suggest a focal abscess. Small umbilical hernia containing fat is remote from this area.  The liver, spleen, gallbladder, pancreas, adrenal glands, kidneys, abdominal aorta, inferior vena cava, and retroperitoneal lymph nodes are unremarkable. Stomach, small bowel, and colon are not abnormally distended. No free air or free fluid in the abdomen.  Pelvis: Appendix is normal. Uterus and ovaries are not enlarged. Bladder wall is not thickened. No free or loculated pelvic fluid collections. No pelvic mass or lymphadenopathy. Mild degenerative changes in the lumbar spine. No destructive bone lesions.  IMPRESSION: Infiltration in the subcutaneous fat of the anterior abdominal wall at the midline below the level of the xiphoid process. This appearance is consistent with cellulitis or possibly contusion. No discrete  fluid collection to suggest an abscess.   Electronically Signed   By: Burman Nieves M.D.   On: 08/27/2015 02:41      EKG Interpretation None      MDM   Final diagnoses:  None    Patient presents to the emergency department for cellulitis on her abdomen. She was seen earlier and left AGAINST MEDICAL ADVICE. Prior to leaving she received vancomycin. Bedside ultrasound does not reveal any abscess formation. Patient is tachycardic, she'll be given IV fluids. CT scan was ordered as part of the initial plan. I doubt any intra-abdominal extension of the infection. Patient is requesting to leave with oral anti-biotics, strict return precautions were given.  CT scan reveals only a superficial cellulitis. Patient will be given Keflex and doxycycline to take. Strict return precautions given. Her heart rate has come down to 92 after IV fluids. She otherwise appears well in acute distress. Vital signs remain within her normal limits and she is safe for  discharge.  EMERGENCY DEPARTMENT US SOFT TISSUE INTERPRETATION "Study: Limited Ultrasound of the noted body part in comments below"  INDICATIONS: Soft tissue infection Multiple views of the body part are obtained with a multi-frequency linear probe  PERFORMED BY:  Myself  IMAGES ARCHIVED?: Yes  SIDE:Midline  BODY PART:Abdominal wall  FINDINGS: No abcess noted  LIMITATIONS:  None  INTERPRETATION:  No abcess noted  COMMENT:  Cellulitis with cobblestoning     I personally performed the services described in this documentation, which was scribed in my presence. The recorded information has been reviewed and is accurate.     Tomasita Crumble, MD 08/27/15 0300

## 2015-12-30 ENCOUNTER — Emergency Department (HOSPITAL_COMMUNITY): Payer: No Typology Code available for payment source

## 2015-12-30 ENCOUNTER — Emergency Department (HOSPITAL_COMMUNITY)
Admission: EM | Admit: 2015-12-30 | Discharge: 2015-12-30 | Disposition: A | Discharge status: Home / Self Care | Payer: No Typology Code available for payment source | Attending: Emergency Medicine | Admitting: Emergency Medicine

## 2015-12-30 DIAGNOSIS — M6289 Other specified disorders of muscle: Secondary | ICD-10-CM | POA: Diagnosis not present

## 2015-12-30 DIAGNOSIS — R531 Weakness: Secondary | ICD-10-CM

## 2015-12-30 DIAGNOSIS — G43909 Migraine, unspecified, not intractable, without status migrainosus: Secondary | ICD-10-CM | POA: Insufficient documentation

## 2015-12-30 DIAGNOSIS — R200 Anesthesia of skin: Secondary | ICD-10-CM | POA: Diagnosis not present

## 2015-12-30 DIAGNOSIS — G43109 Migraine with aura, not intractable, without status migrainosus: Secondary | ICD-10-CM

## 2015-12-30 LAB — COMPREHENSIVE METABOLIC PANEL
ALK PHOS: 90 U/L (ref 38–126)
ALT: 25 U/L (ref 14–54)
ANION GAP: 9 (ref 5–15)
AST: 31 U/L (ref 15–41)
Albumin: 3.8 g/dL (ref 3.5–5.0)
BILIRUBIN TOTAL: 0.3 mg/dL (ref 0.3–1.2)
BUN: 8 mg/dL (ref 6–20)
CALCIUM: 9 mg/dL (ref 8.9–10.3)
CO2: 24 mmol/L (ref 22–32)
CREATININE: 0.98 mg/dL (ref 0.44–1.00)
Chloride: 111 mmol/L (ref 101–111)
Glucose, Bld: 93 mg/dL (ref 65–99)
Potassium: 4.5 mmol/L (ref 3.5–5.1)
SODIUM: 144 mmol/L (ref 135–145)
TOTAL PROTEIN: 7.8 g/dL (ref 6.5–8.1)

## 2015-12-30 LAB — I-STAT CHEM 8, ED
BUN: 5 mg/dL — ABNORMAL LOW (ref 6–20)
Calcium, Ion: 1.05 mmol/L — ABNORMAL LOW (ref 1.12–1.23)
Chloride: 109 mmol/L (ref 101–111)
Creatinine, Ser: 0.9 mg/dL (ref 0.44–1.00)
Glucose, Bld: 89 mg/dL (ref 65–99)
HEMATOCRIT: 47 % — AB (ref 36.0–46.0)
HEMOGLOBIN: 16 g/dL — AB (ref 12.0–15.0)
POTASSIUM: 4.4 mmol/L (ref 3.5–5.1)
SODIUM: 143 mmol/L (ref 135–145)
TCO2: 23 mmol/L (ref 0–100)

## 2015-12-30 LAB — CBC
HCT: 42.5 % (ref 36.0–46.0)
Hemoglobin: 13.9 g/dL (ref 12.0–15.0)
MCH: 33.5 pg (ref 26.0–34.0)
MCHC: 32.7 g/dL (ref 30.0–36.0)
MCV: 102.4 fL — ABNORMAL HIGH (ref 78.0–100.0)
Platelets: 164 10*3/uL (ref 150–400)
RBC: 4.15 MIL/uL (ref 3.87–5.11)
RDW: 14.8 % (ref 11.5–15.5)
WBC: 6.2 10*3/uL (ref 4.0–10.5)

## 2015-12-30 LAB — DIFFERENTIAL
BASOS ABS: 0 10*3/uL (ref 0.0–0.1)
Basophils Relative: 0 %
EOS PCT: 1 %
Eosinophils Absolute: 0.1 10*3/uL (ref 0.0–0.7)
Lymphocytes Relative: 48 %
Lymphs Abs: 3 10*3/uL (ref 0.7–4.0)
MONOS PCT: 6 %
Monocytes Absolute: 0.4 10*3/uL (ref 0.1–1.0)
NEUTROS PCT: 45 %
Neutro Abs: 2.8 10*3/uL (ref 1.7–7.7)

## 2015-12-30 LAB — RAPID URINE DRUG SCREEN, HOSP PERFORMED
AMPHETAMINES: NOT DETECTED
Barbiturates: NOT DETECTED
Benzodiazepines: NOT DETECTED
Cocaine: NOT DETECTED
OPIATES: NOT DETECTED
Tetrahydrocannabinol: NOT DETECTED

## 2015-12-30 LAB — I-STAT TROPONIN, ED: TROPONIN I, POC: 0 ng/mL (ref 0.00–0.08)

## 2015-12-30 LAB — PROTIME-INR
INR: 1.04 (ref 0.00–1.49)
Prothrombin Time: 13.8 seconds (ref 11.6–15.2)

## 2015-12-30 LAB — ETHANOL: ALCOHOL ETHYL (B): 52 mg/dL — AB (ref <5)

## 2015-12-30 LAB — URINALYSIS, ROUTINE W REFLEX MICROSCOPIC
Glucose, UA: NEGATIVE mg/dL
Hgb urine dipstick: NEGATIVE
Ketones, ur: NEGATIVE mg/dL
LEUKOCYTES UA: NEGATIVE
NITRITE: NEGATIVE
PH: 6.5 (ref 5.0–8.0)
Protein, ur: NEGATIVE mg/dL
SPECIFIC GRAVITY, URINE: 1.028 (ref 1.005–1.030)

## 2015-12-30 LAB — APTT: APTT: 27 s (ref 24–37)

## 2015-12-30 MED ORDER — KETOROLAC TROMETHAMINE 30 MG/ML IJ SOLN
30.0000 mg | Freq: Once | INTRAMUSCULAR | Status: AC
  Administered 2015-12-30: 30 mg via INTRAVENOUS
  Filled 2015-12-30: qty 1

## 2015-12-30 MED ORDER — DEXAMETHASONE SODIUM PHOSPHATE 10 MG/ML IJ SOLN
10.0000 mg | Freq: Once | INTRAMUSCULAR | Status: AC
  Administered 2015-12-30: 10 mg via INTRAVENOUS
  Filled 2015-12-30: qty 1

## 2015-12-30 MED ORDER — METOCLOPRAMIDE HCL 5 MG/ML IJ SOLN
10.0000 mg | Freq: Once | INTRAMUSCULAR | Status: AC
  Administered 2015-12-30: 10 mg via INTRAVENOUS
  Filled 2015-12-30: qty 2

## 2015-12-30 MED ORDER — DIPHENHYDRAMINE HCL 50 MG/ML IJ SOLN
25.0000 mg | Freq: Once | INTRAMUSCULAR | Status: AC
  Administered 2015-12-30: 25 mg via INTRAVENOUS
  Filled 2015-12-30: qty 1

## 2015-12-30 NOTE — ED Provider Notes (Signed)
CSN: [id number]     Arrival date & time 12/30/15  1155 History   First MD Initiated Contact with Patient 12/30/15 1208     Chief Complaint  Patient presents with  . Weakness     (Consider location/radiation/quality/duration/timing/severity/associated sxs/prior Treatment) Patient is a 35 y.o. female presenting with weakness.  Weakness This is a new problem. The current episode started 1 to 2 hours ago. The problem occurs constantly. The problem has not changed since onset.Associated symptoms include headaches (back of head). Pertinent negatives include no shortness of breath. Nothing aggravates the symptoms. Nothing relieves the symptoms. She has tried nothing for the symptoms. The treatment provided no relief.    Past Medical History  Diagnosis Date  . Migraines   . Obesity    No past surgical history on file. No family history on file. Social History  Substance Use Topics  . Smoking status: Current Every Day Smoker -- 0.50 packs/day    Types: Cigarettes  . Smokeless tobacco: Not on file  . Alcohol Use: Yes     Comment: Pt admits to a fifth of liquor a day   OB History    No data available     Review of Systems  Unable to perform ROS: Acuity of condition  Constitutional: Negative for fever.  Respiratory: Negative for shortness of breath.   Neurological: Positive for weakness, numbness and headaches (back of head). Negative for syncope, facial asymmetry and speech difficulty.      Allergies  Review of patient's allergies indicates no known allergies.  Home Medications   Prior to Admission medications   Medication Sig Start Date End Date Taking? Authorizing Provider  aspirin-acetaminophen-caffeine (EXCEDRIN MIGRAINE) [phone] MG tablet Take 1-2 tablets by mouth every 6 (six) hours as needed for headache.   Yes Historical Provider, MD  furosemide (LASIX) 20 MG tablet Take 20 mg by mouth daily as needed for fluid.   Yes Historical Provider, MD  ibuprofen  (ADVIL,MOTRIN) 200 MG tablet Take 200 mg by mouth every 6 (six) hours as needed for mild pain.   Yes Historical Provider, MD  medroxyPROGESTERone (DEPO-PROVERA) 150 MG/ML injection Inject 150 mg into the muscle every 3 (three) months.   Yes Historical Provider, MD  cephALEXin (KEFLEX) 500 MG capsule Take 1 capsule (500 mg total) by mouth 2 (two) times daily. Patient not taking: Reported on 12/30/2015 08/27/15   Tomasita Crumble, MD  doxycycline (VIBRAMYCIN) 100 MG capsule Take 1 capsule (100 mg total) by mouth 2 (two) times daily. One po bid x 7 days Patient not taking: Reported on 12/30/2015 08/27/15   Tomasita Crumble, MD   BP 125/99 mmHg  Pulse 91  Temp(Src) 98.2 F (36.8 C) (Oral)  Resp 18  SpO2 100%  LMP  Physical Exam  Constitutional: She is oriented to person, place, and time. She appears well-developed and well-nourished. No distress.  HENT:  Head: Normocephalic and atraumatic.  Eyes: Conjunctivae and EOM are normal.  Neck: Normal range of motion.  Cardiovascular: Normal rate, regular rhythm, normal heart sounds and intact distal pulses.  Exam reveals no gallop and no friction rub.   No murmur heard. Pulmonary/Chest: Effort normal and breath sounds normal. No respiratory distress. She has no wheezes. She has no rales.  Abdominal: Soft. She exhibits no distension. There is no tenderness. There is no guarding.  Musculoskeletal: She exhibits no edema or tenderness.  Neurological: She is alert and oriented to person, place, and time. A sensory deficit (right upper extremity) is present. No cranial  nerve deficit. GCS eye subscore is 4. GCS verbal subscore is 5. GCS motor subscore is 6.  LLE 5/5, RLE 3/5   Skin: Skin is warm and dry. No rash noted. She is not diaphoretic. No erythema.  Nursing note and vitals reviewed.   ED Course  Procedures (including critical care time) Labs Review Labs Reviewed  ETHANOL - Abnormal; Notable for the following:    Alcohol, Ethyl (B) 52 (*)    All other  components within normal limits  CBC - Abnormal; Notable for the following:    MCV 102.4 (*)    All other components within normal limits  URINALYSIS, ROUTINE W REFLEX MICROSCOPIC (NOT AT [facility]) - Abnormal; Notable for the following:    Color, Urine AMBER (*)    Bilirubin Urine SMALL (*)    All other components within normal limits  I-STAT CHEM 8, ED - Abnormal; Notable for the following:    BUN 5 (*)    Calcium, Ion 1.05 (*)    Hemoglobin 16.0 (*)    HCT 47.0 (*)    All other components within normal limits  PROTIME-INR  APTT  DIFFERENTIAL  COMPREHENSIVE METABOLIC PANEL  URINE RAPID DRUG SCREEN, HOSP PERFORMED  I-STAT TROPOININ, ED  I-STAT BETA HCG BLOOD, ED ([facility], [facility], [facility] ONLY)    Imaging Review Ct Head Wo Contrast  12/30/2015  CLINICAL DATA:  Right side weakness, code stroke EXAM: CT HEAD WITHOUT CONTRAST TECHNIQUE: Contiguous axial images were obtained from the base of the skull through the vertex without intravenous contrast. COMPARISON:  Report 04/03/2002 no images available FINDINGS: No skull fracture is noted. No intracranial hemorrhage, mass effect or midline shift. No acute cortical infarction. No mass lesion is noted on this unenhanced scan. Paranasal sinuses and mastoid air cells are unremarkable. No intra or extra-axial fluid collection. No hydrocephalus. IMPRESSION: No acute intracranial abnormality. No definite acute cortical infarction. These results were called by telephone at the time of interpretation on 12/30/2015 at 12:26 pm to Dr. Roseanne RenoStewart , who verbally acknowledged these results. Electronically Signed   By: Natasha MeadLiviu  Pop M.D.   On: 12/30/2015 12:27   Mr Brain Ltd W/o Cm  12/30/2015  CLINICAL DATA:  Right-sided weakness. Code stroke. Diffusion only imaging per neurology request. EXAM: MRI HEAD WITHOUT CONTRAST TECHNIQUE: Multiplanar, multiecho pulse sequences of the brain and surrounding structures were obtained without intravenous contrast. COMPARISON:  Head CT from earlier  today FINDINGS: Axial diffusion only was performed. When accounting for superficial areas of susceptibility artifact, more prominent that unusual in the setting of multi focal falx ossification, there is no evidence of acute infarct. No hydrocephalus or shift. IMPRESSION: Diffusion only study that is negative for infarct or other acute finding. Electronically Signed   By: Marnee SpringJonathon  Watts M.D.   On: 12/30/2015 13:34   I have personally reviewed and evaluated these images and lab results as part of my medical decision-making.   EKG Interpretation   Date/Time:  Thursday December 30 2015 12:02:09 EST Ventricular Rate:  101 PR Interval:  119 QRS Duration: 93 QT Interval:  360 QTC Calculation: 467 R Axis:   51 Text Interpretation:  Sinus tachycardia Otherwise within normal limits No  previous tracing Confirmed by Anitra LauthPLUNKETT  MD, Alphonzo LemmingsWHITNEY ([id number]) on 12/30/2015  1:19:19 PM      MDM   Final diagnoses:  Complicated migraine    [age] old female with a history of obesity presents with concern for right-sided weakness and numbness beginning 2 hours ago.  Patient reports  she was at work and began to have a pain in the back of her head radiating down to her neck and right arm numbness. She also reported some difficulty walking due to right leg numbness. On patient's physical exam she shows right lower extremity weakness, and numbness of the right upper extremity. A code stroke was activated. CT head WNL. Glucose WNL. Transferred to [facility] ED for Neurology evaluation.      Alvira Monday, MD 12/30/15 [phone]

## 2015-12-30 NOTE — ED Notes (Signed)
[facility]. Pt reports sudden onset of R hand numbness that she noted while at work at 1000 today.  Pt reports pain to back of R ear that radiates into neck x 3 days.  Neurology at bedside.

## 2015-12-30 NOTE — ED Notes (Signed)
MD at bedside. 

## 2015-12-30 NOTE — ED Notes (Signed)
Code Stroke deactivated.

## 2015-12-30 NOTE — Consult Note (Signed)
Admission H&P    Chief Complaint: Acute onset of weakness and numbness involving right extremities.  HPI: Sonya Hurst is an 35 y.o. female with a history of migraine headaches and obesity, presenting with new onset weakness and numbness involving right extremities, absent new-onset. She was last known well at 11 AM today. She has no previous history of stroke nor TIA. She has not been on antiplatelet therapy. Also complaining of pain in the back of her neck and head, which is different from her typical migraine headaches. CT scan of the head showed no acute intracranial abnormality. MRI of the brain showed no acute ischemic lesion. Patient was initially managed code stroke protocol was subsequently canceled. NIH stroke score was 4 with subjective sensory changes and inconsistent weakness of right lower extremity and minimal drift of right upper extremity. She was not deemed a candidate for lytic therapy with TPA because of the lack of clear objective deficits and normal MRI of the brain.  Past Medical History  Diagnosis Date  . Migraines   . Obesity     No past surgical history on file.  Family history: Positive for stroke involving one grandparent.  Social History:  reports that she has been smoking Cigarettes.  She has been smoking about 0.50 packs per day. She does not have any smokeless tobacco history on file. She reports that she drinks alcohol. She reports that she does not use illicit drugs.  Allergies: No Known Allergies  Medications: Patient's preadmission medications were reviewed by me.  ROS: History obtained from the patient  General ROS: negative for - chills, fatigue, fever, night sweats, weight gain or weight loss Psychological ROS: negative for - behavioral disorder, hallucinations, memory difficulties, mood swings or suicidal ideation Ophthalmic ROS: negative for - blurry vision, double vision, eye pain or loss of vision ENT ROS: negative for - epistaxis, nasal  discharge, oral lesions, sore throat, tinnitus or vertigo Allergy and Immunology ROS: negative for - hives or itchy/watery eyes Hematological and Lymphatic ROS: negative for - bleeding problems, bruising or swollen lymph nodes Endocrine ROS: negative for - galactorrhea, hair pattern changes, polydipsia/polyuria or temperature intolerance Respiratory ROS: negative for - cough, hemoptysis, shortness of breath or wheezing Cardiovascular ROS: negative for - chest pain, dyspnea on exertion, edema or irregular heartbeat Gastrointestinal ROS: negative for - abdominal pain, diarrhea, hematemesis, nausea/vomiting or stool incontinence Genito-Urinary ROS: negative for - dysuria, hematuria, incontinence or urinary frequency/urgency Musculoskeletal ROS: negative for - joint swelling or muscular weakness Neurological ROS: as noted in HPI Dermatological ROS: negative for rash and skin lesion changes  Physical Examination: Blood pressure 111/72, pulse 94, temperature 98.2 F (36.8 C), temperature source Oral, resp. rate 18, SpO2 100 %.  HEENT-  Normocephalic, no lesions, without obvious abnormality.  Normal external eye and conjunctiva.  Normal TM's bilaterally.  Normal auditory canals and external ears. Normal external nose, mucus membranes and septum.  Normal pharynx. Neck supple with no masses, nodes, nodules or enlargement. Cardiovascular - regular rate and rhythm, S1, S2 normal, no murmur, click, rub or gallop Lungs - chest clear, no wheezing, rales, normal symmetric air entry Abdomen - soft, non-tender; bowel sounds normal; no masses,  no organomegaly Extremities - no joint deformities, effusion, or inflammation and no edema  Neurologic Examination: Mental Status: Alert, oriented, no acute distress, flat affect.  Speech fluent without evidence of aphasia. Able to follow commands without difficulty. Cranial Nerves: II-Visual fields were normal. III/IV/VI-Pupils were equal and reacted normally to  light. Extraocular  movements were full and conjugate.    V/VII-no facial numbness and no facial weakness. VIII-normal. X-normal speech and symmetrical palatal movement. XI: trapezius strength/neck flexion strength normal bilaterally XII-midline tongue extension with normal strength. Motor: Minimal drift of right upper extremity; able to lift right foot against gravity; effort was noted to be fairly poor. Muscle tone was normal throughout. Strength of left extremities was normal. Sensory: Numbness of right lower extremity to tactile stimulation. Deep Tendon Reflexes: 2+ and symmetric. Plantars: Mute bilaterally Cerebellar: Normal finger-to-nose testing. Carotid auscultation: Normal  Results for orders placed or performed during the hospital encounter of 12/30/15 (from the past 48 hour(s))  Ethanol     Status: Abnormal   Collection Time: 12/30/15 12:28 PM  Result Value Ref Range   Alcohol, Ethyl (B) 52 (H) <5 mg/dL    Comment:        LOWEST DETECTABLE LIMIT FOR SERUM ALCOHOL IS 5 mg/dL FOR MEDICAL PURPOSES ONLY   Protime-INR     Status: None   Collection Time: 12/30/15 12:28 PM  Result Value Ref Range   Prothrombin Time 13.8 11.6 - 15.2 seconds   INR 1.04 0.00 - 1.49  APTT     Status: None   Collection Time: 12/30/15 12:28 PM  Result Value Ref Range   aPTT 27 24 - 37 seconds  CBC     Status: Abnormal   Collection Time: 12/30/15 12:28 PM  Result Value Ref Range   WBC 6.2 4.0 - 10.5 K/uL   RBC 4.15 3.87 - 5.11 MIL/uL   Hemoglobin 13.9 12.0 - 15.0 g/dL   HCT 42.5 36.0 - 46.0 %   MCV 102.4 (H) 78.0 - 100.0 fL   MCH 33.5 26.0 - 34.0 pg   MCHC 32.7 30.0 - 36.0 g/dL   RDW 14.8 11.5 - 15.5 %   Platelets 164 150 - 400 K/uL  Differential     Status: None   Collection Time: 12/30/15 12:28 PM  Result Value Ref Range   Neutrophils Relative % 45 %   Neutro Abs 2.8 1.7 - 7.7 K/uL   Lymphocytes Relative 48 %   Lymphs Abs 3.0 0.7 - 4.0 K/uL   Monocytes Relative 6 %   Monocytes  Absolute 0.4 0.1 - 1.0 K/uL   Eosinophils Relative 1 %   Eosinophils Absolute 0.1 0.0 - 0.7 K/uL   Basophils Relative 0 %   Basophils Absolute 0.0 0.0 - 0.1 K/uL  Comprehensive metabolic panel     Status: None   Collection Time: 12/30/15 12:28 PM  Result Value Ref Range   Sodium 144 135 - 145 mmol/L   Potassium 4.5 3.5 - 5.1 mmol/L   Chloride 111 101 - 111 mmol/L   CO2 24 22 - 32 mmol/L   Glucose, Bld 93 65 - 99 mg/dL   BUN 8 6 - 20 mg/dL   Creatinine, Ser 0.98 0.44 - 1.00 mg/dL   Calcium 9.0 8.9 - 10.3 mg/dL   Total Protein 7.8 6.5 - 8.1 g/dL   Albumin 3.8 3.5 - 5.0 g/dL   AST 31 15 - 41 U/L   ALT 25 14 - 54 U/L   Alkaline Phosphatase 90 38 - 126 U/L   Total Bilirubin 0.3 0.3 - 1.2 mg/dL   GFR calc non Af Amer >60 >60 mL/min   GFR calc Af Amer >60 >60 mL/min    Comment: (NOTE) The eGFR has been calculated using the CKD EPI equation. This calculation has not been validated in all clinical  situations. eGFR's persistently <60 mL/min signify possible Chronic Kidney Disease.    Anion gap 9 5 - 15  I-stat troponin, ED (not at [facility], [facility])     Status: None   Collection Time: 12/30/15 12:31 PM  Result Value Ref Range   Troponin i, poc 0.00 0.00 - 0.08 ng/mL   Comment 3            Comment: Due to the release kinetics of cTnI, a negative result within the first hours of the onset of symptoms does not rule out myocardial infarction with certainty. If myocardial infarction is still suspected, repeat the test at appropriate intervals.   I-Stat Chem 8, ED  (not at [facility], [facility])     Status: Abnormal   Collection Time: 12/30/15 12:33 PM  Result Value Ref Range   Sodium 143 135 - 145 mmol/L   Potassium 4.4 3.5 - 5.1 mmol/L   Chloride 109 101 - 111 mmol/L   BUN 5 (L) 6 - 20 mg/dL   Creatinine, Ser 0.90 0.44 - 1.00 mg/dL   Glucose, Bld 89 65 - 99 mg/dL   Calcium, Ion 1.05 (L) 1.12 - 1.23 mmol/L   TCO2 23 0 - 100 mmol/L   Hemoglobin 16.0 (H) 12.0 - 15.0 g/dL   HCT 47.0 (H) 36.0 - 46.0  %   Ct Head Wo Contrast  12/30/2015  CLINICAL DATA:  Right side weakness, code stroke EXAM: CT HEAD WITHOUT CONTRAST TECHNIQUE: Contiguous axial images were obtained from the base of the skull through the vertex without intravenous contrast. COMPARISON:  Report 04/03/2002 no images available FINDINGS: No skull fracture is noted. No intracranial hemorrhage, mass effect or midline shift. No acute cortical infarction. No mass lesion is noted on this unenhanced scan. Paranasal sinuses and mastoid air cells are unremarkable. No intra or extra-axial fluid collection. No hydrocephalus. IMPRESSION: No acute intracranial abnormality. No definite acute cortical infarction. These results were called by telephone at the time of interpretation on 12/30/2015 at 12:26 pm to Dr. Nicole Kindred , who verbally acknowledged these results. Electronically Signed   By: Lahoma Crocker M.D.   On: 12/30/2015 12:27   Mr Brain Ltd W/o Cm  12/30/2015  CLINICAL DATA:  Right-sided weakness. Code stroke. Diffusion only imaging per neurology request. EXAM: MRI HEAD WITHOUT CONTRAST TECHNIQUE: Multiplanar, multiecho pulse sequences of the brain and surrounding structures were obtained without intravenous contrast. COMPARISON:  Head CT from earlier today FINDINGS: Axial diffusion only was performed. When accounting for superficial areas of susceptibility artifact, more prominent that unusual in the setting of multi focal falx ossification, there is no evidence of acute infarct. No hydrocephalus or shift. IMPRESSION: Diffusion only study that is negative for infarct or other acute finding. Electronically Signed   By: Monte Fantasia M.D.   On: 12/30/2015 13:34    Assessment/Plan 35 year old lady presenting with complaint of weakness and numbness of right extremities with no clear objective findings clinically and no indication of an acute intracranial lesion including no signs of an acute ischemic lesion. Psychophysiologic factors strongly  suspected.  Recommendations: 1. Management of pain per ED physician 2. Consider cervical spine MRI if patient's symptoms persist, as well as admission for observation and physical therapy evaluation.  C.R. Nicole Kindred, MD [facility] [phone]  12/30/2015, 1:49 PM

## 2015-12-30 NOTE — ED Notes (Signed)
Pt c/o right neck pain radiating to right eye. Pt became unable to move right arm and began feeling right arm numbness onset within the past hour.

## 2015-12-30 NOTE — Code Documentation (Signed)
35yo [facility] at [id number].  [facility]/o right sided numbness and weakness.  [facility]  Stroke team to the bedside.  NIHSS 4, see documentation for details and code stroke times.  Patient with right leg weakness and decreased sensation. Patient reporting HA x several days.  Patient to MRI per Dr. Roseanne RenoStewart.  Patient transported to MRI with ED RN and Stroke RN.  MRI DWI imaging negative for acute infarct per MD.  Patient transported back to D30.  Code stroke canceled.  Bedside handoff with ED RN Amil AmenJulia.

## 2015-12-30 NOTE — ED Notes (Signed)
Pt in CT.

## 2015-12-30 NOTE — ED Provider Notes (Signed)
Patient presenting to the ED after being transferred from Sonya Hurst is an acute code stroke presenting with persistent left posterior headache, right upper and lower extremity weakness most pronounced in the lower extremity without other neurologic findings. Patient has a history of migraine headaches but states this one is different. She denies any infectious symptoms.    Dr. Roseanne RenoStewart at bedside when patient arrives. She is still having ongoing deficits at this time. MRI ordered for further evaluation.  [facility] are all without acute findings except for an alcohol level of 52.    2:52 PM Brain MRI is negative for acute pathology. After headache cocktail patient's symptoms have completely resolved. She was able to ambulate to the bathroom without difficulty and has no signs of weakness to the upper or lower right extremity at this time. Possible that this is psychogenic versus complicated migraine. Either way patient's symptoms have resolved and feel she is safe for discharge home.  MR Brain Ltd W/O Cm (Final result) Result time: 12/30/15 13:34:13   Procedure changed from MR Brain Wo Contrast      Final result by Rad Results In Interface (12/30/15 13:34:13)   Narrative:   CLINICAL DATA: Right-sided weakness. Code stroke. Diffusion only imaging per neurology request.  EXAM: MRI HEAD WITHOUT CONTRAST  TECHNIQUE: Multiplanar, multiecho pulse sequences of the brain and surrounding structures were obtained without intravenous contrast.  COMPARISON: Head CT from earlier today  FINDINGS: Axial diffusion only was performed. When accounting for superficial areas of susceptibility artifact, more prominent that unusual in the setting of multi focal falx ossification, there is no evidence of acute infarct. No hydrocephalus or shift.  IMPRESSION: Diffusion only study that is negative for infarct or other acute finding.   Electronically Signed By: Marnee SpringJonathon  Watts M.D. On: 12/30/2015 13:34     Gwyneth SproutWhitney Cammeron Greis, MD 12/30/15 1453

## 2015-12-30 NOTE — ED Notes (Signed)
Bed: WTR8 Expected date:  Expected time:  Means of arrival:  Comments: Dirty

## 2015-12-30 NOTE — ED Notes (Signed)
Code Stroke call to [facility] by DR Dalene SeltzerSchlossman.

## 2015-12-30 NOTE — Discharge Instructions (Signed)

## 2015-12-30 NOTE — ED Notes (Signed)
Patient transported to MRI with RN 

## 2016-01-03 ENCOUNTER — Encounter: Payer: Self-pay | Admitting: Neurology

## 2016-01-03 ENCOUNTER — Ambulatory Visit (INDEPENDENT_AMBULATORY_CARE_PROVIDER_SITE_OTHER): Payer: PRIVATE HEALTH INSURANCE | Admitting: Neurology

## 2016-01-03 VITALS — BP 130/88 | HR 90 | Resp 20 | Ht 65.0 in | Wt 343.0 lb

## 2016-01-03 DIAGNOSIS — R471 Dysarthria and anarthria: Secondary | ICD-10-CM

## 2016-01-03 DIAGNOSIS — M6289 Other specified disorders of muscle: Secondary | ICD-10-CM

## 2016-01-03 DIAGNOSIS — R531 Weakness: Secondary | ICD-10-CM | POA: Insufficient documentation

## 2016-01-03 MED ORDER — GABAPENTIN 300 MG PO CAPS: 300.0000 mg | ORAL_CAPSULE | Freq: Three times a day (TID) | ORAL | Status: DC

## 2016-01-03 NOTE — Progress Notes (Signed)
PATIENT: Sonya Hurst DOB: 1981/12/07  Chief Complaint  Patient presents with  . Cerebrovascular Accident    was told she had [facility], did her blood work, it was normal, she was then transferred to [facility], unsure if she had a stroke, MRI performed,  . Migraine    has a sharp shooting pain behind ear that goes to neck, was given ibuprofen, rm 4     HISTORICAL  Sonya Hurst 35 year old right-handed female, is accompanied by her boyfriend Sonya Hurst,  seen in refer by emergency room for evaluation of headaches, strokelike symptoms  In December 30 2015, while at work, she felt sharp pain at left occipital region, then noticed difficulty talking, right hand shaking, confusion episodes, she also noticed right arm and leg weakness, numbness, difficulty walking, she was taken to the emergency room, she could only remember pieces of the hospital stay, she denies headaches, denies total loss of consciousness, no seizure-like activity,  I have personally reviewed MRI of the brain in January twelfth 2017, only DWI was performed, there was no acute stroke,  I have reviewed laboratory evaluation, normal CMP, CBC, negative UDS, elevated alcohol level more than 52,  She also reported a history of migraine headache, her typical migraine are bifrontal, bitemporal region severe pounding headaches with associated light noise sensitivity, nauseous, lasting for a few hours  REVIEW OF SYSTEMS: Full 14 system review of systems performed and notable only for blurred vision, loss of vision, headaches, numbness, weakness, sleepiness, snoring  ALLERGIES: No Known Allergies  HOME MEDICATIONS: Current Outpatient Prescriptions  Medication Sig Dispense Refill  . aspirin-acetaminophen-caffeine (EXCEDRIN MIGRAINE) 250-250-65 MG tablet Take 1-2 tablets by mouth every 6 (six) hours as needed for headache.    . cephALEXin (KEFLEX) 500 MG capsule Take 1 capsule (500 mg total) by mouth 2 (two) times daily. 14  capsule 0  . doxycycline (VIBRAMYCIN) 100 MG capsule Take 1 capsule (100 mg total) by mouth 2 (two) times daily. One po bid x 7 days 14 capsule 0  . furosemide (LASIX) 20 MG tablet Take 20 mg by mouth daily as needed for fluid.    Marland Kitchen ibuprofen (ADVIL,MOTRIN) 200 MG tablet Take 200 mg by mouth every 6 (six) hours as needed for mild pain.    . medroxyPROGESTERone (DEPO-PROVERA) 150 MG/ML injection Inject 150 mg into the muscle every 3 (three) months.     No current facility-administered medications for this visit.    PAST MEDICAL HISTORY: Past Medical History  Diagnosis Date  . Migraines   . Obesity     PAST SURGICAL HISTORY: No past surgical history on file.  FAMILY HISTORY: No family history on file.  SOCIAL HISTORY:  Social History   Social History  . Marital Status: Single    Spouse Name: N/A  . Number of Children: 4  . Years of Education: N/A   Occupational History  Customer service representative  Social History Main Topics  . Smoking status: Current Every Day Smoker -- 0.50 packs/day for 18 years    Types: Cigarettes  . Smokeless tobacco: Not on file  . Alcohol Use: 0.0 oz/week    0 Standard drinks or equivalent per week     Comment: Pt admits to a fifth of liquor a day, shes says she has 3-4 drinks a day  . Drug Use: No  . Sexual Activity: Yes    Birth Control/ Protection: Implant   Other Topics Concern  . Not on file   Social History  Narrative   Denies caffeine use.     PHYSICAL EXAM   Filed Vitals:   01/03/16 1521  BP: 130/88  Pulse: 90  Resp: 20  Height: 5\' 5"  (1.651 m)  Weight: 343 lb (155.584 kg)    Not recorded      Body mass index is 57.08 kg/(m^2).  PHYSICAL EXAMNIATION:  Gen: NAD, conversant, well nourised, obese, well groomed                     Cardiovascular: Regular rate rhythm, no peripheral edema, warm, nontender. Eyes: Conjunctivae clear without exudates or hemorrhage Neck: Supple, no carotid bruise. Pulmonary: Clear to  auscultation bilaterally   NEUROLOGICAL EXAM:  MENTAL STATUS: Speech:    Speech is normal; fluent and spontaneous with normal comprehension.  Cognition:     Orientation to time, place and person     Normal recent and remote memory     Normal Attention span and concentration     Normal Language, naming, repeating,spontaneous speech     Fund of knowledge   CRANIAL NERVES: CN II: Visual fields are full to confrontation. Fundoscopic exam is normal with sharp discs and no vascular changes. Pupils are round equal and briskly reactive to light. CN III, IV, VI: extraocular movement are normal. No ptosis. CN V: Facial sensation is intact to pinprick in all 3 divisions bilaterally. Corneal responses are intact.  CN VII: Face is symmetric with normal eye closure and smile. CN VIII: Hearing is normal to rubbing fingers CN IX, X: Palate elevates symmetrically. Phonation is normal. CN XI: Head turning and shoulder shrug are intact CN XII: Tongue is midline with normal movements and no atrophy.  MOTOR: There is no pronator drift of out-stretched arms. Muscle bulk and tone are normal. Obesity, she has variable effort on motor examination, there was no significant weakness,  REFLEXES: Reflexes are 2+ and symmetric at the biceps, triceps, knees, and ankles. Plantar responses are flexor.  SENSORY: Intact to light touch, pinprick, position sense, and vibration sense are intact in fingers and toes.  COORDINATION: Rapid alternating movements and fine finger movements are intact. There is no dysmetria on finger-to-nose and heel-knee-shin.    GAIT/STANCE: She drag her right foot across the floor, decreased right arm swing, mildly unsteady, also complicated by her big body habitus  DIAGNOSTIC DATA (LABS, IMAGING, TESTING) - I reviewed patient records, labs, notes, testing and imaging myself where available.   ASSESSMENT AND PLAN  Sonya Hurst is a 35 y.o. female   Presented with acute onset  right-sided weakness, numbness since January twelfth 2017  No acute stroke on DWI  Differentiation diagnosis also including left hemisphere structural lesion versus complicated migraine headaches   proceed with MRI of brain  Ultrasound of carotid artery  Echocardiogram  Confusion dysarthria  EEG  Left cervical pain  Musculoskeletal etiology, when necessary NSAIDs, gabapentin 300 mg 3 times a day  Levert FeinsteinYijun Avaya Mcjunkins, M.D. Ph.D.  [facility] [address], [address] [place], [place] [address] Ph: [phone] Fax: [phone]  CC: Referring Provider

## 2016-01-10 ENCOUNTER — Inpatient Hospital Stay: Admission: RE | Admit: 2016-01-10 | Payer: PRIVATE HEALTH INSURANCE | Source: Ambulatory Visit

## 2016-01-11 ENCOUNTER — Other Ambulatory Visit: Payer: PRIVATE HEALTH INSURANCE

## 2016-01-12 ENCOUNTER — Other Ambulatory Visit: Payer: Self-pay | Admitting: Neurology

## 2016-01-12 DIAGNOSIS — R471 Dysarthria and anarthria: Secondary | ICD-10-CM

## 2016-01-14 ENCOUNTER — Ambulatory Visit (HOSPITAL_COMMUNITY): Admission: RE | Admit: 2016-01-14 | Payer: No Typology Code available for payment source | Source: Ambulatory Visit

## 2016-01-18 ENCOUNTER — Other Ambulatory Visit (HOSPITAL_COMMUNITY): Payer: PRIVATE HEALTH INSURANCE

## 2016-01-28 ENCOUNTER — Other Ambulatory Visit: Payer: PRIVATE HEALTH INSURANCE

## 2016-01-31 ENCOUNTER — Encounter: Payer: Self-pay | Admitting: Neurology

## 2016-02-01 ENCOUNTER — Telehealth: Payer: Self-pay

## 2016-02-01 NOTE — Telephone Encounter (Signed)
PT NO SHOWED EEG but she has a f/u in Feb with Dr. Terrace Arabia.Marland KitchenLVM to call back to r/s EEG and f/u..cannot come to f/u without having EEG

## 2016-02-09 ENCOUNTER — Telehealth: Payer: Self-pay | Admitting: Neurology

## 2016-02-09 ENCOUNTER — Encounter: Payer: Self-pay | Admitting: *Deleted

## 2016-02-09 NOTE — Telephone Encounter (Signed)
Patient needs a work note just stating that she was here for an appt on 01/03/16.  Discussed recommended test and she plans to schedule them.

## 2016-02-09 NOTE — Telephone Encounter (Signed)
Pt called needs the note for being out of work signed and faxed to [phone] attn: Roe Rutherford, pt also needs letter stating length of time she is allowed to work . May call pt at [phone]

## 2016-02-09 NOTE — Telephone Encounter (Signed)
Pt called sts she rec'd missed call from [employer]. She said she needs this note today so she can return to work tomorrow at [employer]

## 2016-02-09 NOTE — Telephone Encounter (Signed)
Patient called back to advise, she doesn't need the note for work.

## 2016-02-09 NOTE — Telephone Encounter (Signed)
Pt called and still needs the note but will come and pick it up.

## 2016-02-14 ENCOUNTER — Ambulatory Visit: Payer: PRIVATE HEALTH INSURANCE | Admitting: Neurology

## 2016-02-16 ENCOUNTER — Other Ambulatory Visit: Payer: Self-pay

## 2016-02-18 ENCOUNTER — Ambulatory Visit: Payer: PRIVATE HEALTH INSURANCE | Attending: Neurology

## 2016-02-23 ENCOUNTER — Telehealth: Payer: Self-pay | Admitting: *Deleted

## 2016-02-23 NOTE — Telephone Encounter (Signed)
Patient [employer] form on [employer] desk.

## 2016-03-01 ENCOUNTER — Telehealth: Payer: Self-pay | Admitting: *Deleted

## 2016-03-01 NOTE — Telephone Encounter (Signed)
Left patient message for a return call.  We are unable to complete paperwork until we speak with her.

## 2016-03-01 NOTE — Telephone Encounter (Signed)
Left message for patient to return my call.  We are unable to complete paperwork until we are able to speak with the patient.

## 2016-03-02 NOTE — Telephone Encounter (Signed)
Patient called earlier and stated she did not leave any paperwork to be completed.  The paperwork received came directly from her employer concerning her short term disability request. I left patient another message letting her know I would keep paperwork until her 03/09/16 appointment unless I hear otherwise.

## 2016-03-02 NOTE — Telephone Encounter (Signed)
Patient returned Michelle's call. °

## 2016-03-02 NOTE — Telephone Encounter (Signed)
Left another message for a return call

## 2016-03-02 NOTE — Telephone Encounter (Signed)
Patient called earlier and stated she did not leave any paperwork to be completed.  The paperwork received came directly from her employer concerning her short term disability request. I left patient another message letting her know I would keep paperwork until her 03/09/16 appointment unless I hear otherwise.    

## 2016-03-02 NOTE — Telephone Encounter (Signed)
Attempted call to patient again.  Unable to reach - left another message.

## 2016-03-03 ENCOUNTER — Other Ambulatory Visit: Payer: PRIVATE HEALTH INSURANCE

## 2016-03-06 ENCOUNTER — Encounter: Payer: Self-pay | Admitting: Neurology

## 2016-03-07 ENCOUNTER — Telehealth: Payer: Self-pay | Admitting: *Deleted

## 2016-03-07 ENCOUNTER — Ambulatory Visit: Payer: PRIVATE HEALTH INSURANCE | Admitting: Neurology

## 2016-03-07 NOTE — Telephone Encounter (Signed)
No showed follow up appointment. 

## 2016-03-07 NOTE — Telephone Encounter (Addendum)
Patient no showed her EEG on 03/01/16 and her follow up on 03/09/16.  She has not returned calls to schedule the tests Dr. Terrace ArabiaYan ordered.  We will not be able to complete any paperwork for her until she comes back in for an appointment.

## 2016-03-08 ENCOUNTER — Encounter: Payer: Self-pay | Admitting: Neurology

## 2016-03-08 NOTE — Telephone Encounter (Signed)
Patient no showed two EEG appts and one follow up appt over the last few weeks.  Per office policy, she will be dismissed from our practice.  We are unable to complete her paperwork because she failed to return for her ordered tests and follow up appts.  

## 2016-03-08 NOTE — Telephone Encounter (Signed)
Patient no showed two EEG appts and one follow up appt over the last few weeks.  Per office policy, she will be dismissed from our practice.  We are unable to complete her paperwork because she failed to return for her ordered tests and follow up appts.

## 2016-05-31 ENCOUNTER — Emergency Department (HOSPITAL_COMMUNITY)
Admission: EM | Admit: 2016-05-31 | Discharge: 2016-06-01 | Disposition: A | Discharge status: Home / Self Care | Payer: No Typology Code available for payment source | Attending: Emergency Medicine | Admitting: Emergency Medicine

## 2016-05-31 ENCOUNTER — Encounter (HOSPITAL_COMMUNITY): Payer: Self-pay | Admitting: Emergency Medicine

## 2016-05-31 DIAGNOSIS — Z7982 Long term (current) use of aspirin: Secondary | ICD-10-CM | POA: Insufficient documentation

## 2016-05-31 DIAGNOSIS — F17210 Nicotine dependence, cigarettes, uncomplicated: Secondary | ICD-10-CM | POA: Diagnosis not present

## 2016-05-31 DIAGNOSIS — R000 Tachycardia, unspecified: Secondary | ICD-10-CM | POA: Insufficient documentation

## 2016-05-31 DIAGNOSIS — Z79899 Other long term (current) drug therapy: Secondary | ICD-10-CM | POA: Diagnosis not present

## 2016-05-31 DIAGNOSIS — E669 Obesity, unspecified: Secondary | ICD-10-CM | POA: Diagnosis not present

## 2016-05-31 DIAGNOSIS — Z791 Long term (current) use of non-steroidal anti-inflammatories (NSAID): Secondary | ICD-10-CM | POA: Diagnosis not present

## 2016-05-31 DIAGNOSIS — E162 Hypoglycemia, unspecified: Secondary | ICD-10-CM | POA: Diagnosis present

## 2016-05-31 NOTE — ED Notes (Signed)
Bed: [id number] Expected date:  Expected time:  Means of arrival:  Comments: Weakness, hypoglycemic

## 2016-05-31 NOTE — ED Notes (Addendum)
Patient presents from home via EMS for hypoglycemia (cbg 34 at home), no history of DM. D50 given at site, cbg up to 160. Patient states she's been trying to lose weight and hasn't eaten in a week. D10 (100cc in route) drip started in route.   Last VS: 140/90, 110st, 98%ra.   20g right AC, 20g left ac.

## 2016-06-01 LAB — CBG MONITORING, ED
GLUCOSE-CAPILLARY: 74 mg/dL (ref 65–99)
GLUCOSE-CAPILLARY: 80 mg/dL (ref 65–99)
Glucose-Capillary: 88 mg/dL (ref 65–99)

## 2016-06-01 NOTE — Discharge Instructions (Signed)
It is VERY important that you eat and a daily basis and minimum of 600 cal, even if you do not feels hungry.  Hypoglycemia Low blood sugar (hypoglycemia) means that the level of sugar in your blood is lower than it should be. Signs of low blood sugar include:  Getting sweaty.  Feeling hungry.  Feeling dizzy or weak.  Feeling sleepier than normal.  Feeling nervous.  Headaches.  Having a fast heartbeat. Low blood sugar can happen fast and can be an emergency. Your doctor can do tests to check your blood sugar level. You can have low blood sugar and not have diabetes. HOME CARE  Check your blood sugar as told by your doctor. If it is less than 70 mg/dl or as told by your doctor, take 1 of the following:  3 to 4 glucose tablets.   cup clear juice.   cup soda pop, not diet.  1 cup milk.  5 to 6 hard candies.  Recheck blood sugar after 15 minutes. Repeat until it is at the right level.  Eat a snack if it is more than 1 hour until the next meal.  Only take medicine as told by your doctor.  Do not skip meals. Eat on time.  Do not drink alcohol except with meals.  Check your blood glucose before driving.  Check your blood glucose before and after exercise.  Always carry treatment with you, such as glucose pills.  Always wear a medical alert bracelet if you have diabetes. GET HELP RIGHT AWAY IF:   Your blood glucose goes below 70 mg/dl or as told by your doctor, and you:  Are confused.  Are not able to swallow.  Pass out (faint).  You cannot treat yourself. You may need someone to help you.  You have low blood sugar problems often.  You have problems from your medicines.  You are not feeling better after 3 to 4 days.  You have vision changes. MAKE SURE YOU:   Understand these instructions.  Will watch this condition.  Will get help right away if you are not doing well or get worse.   This information is not intended to replace advice given to you  by your health care provider. Make sure you discuss any questions you have with your health care provider.   Document Released: 02/28/2010 Document Revised: 12/25/2014 Document Reviewed: 08/10/2015 Elsevier Interactive Patient Education [employer]2016 Elsevier Inc.

## 2016-06-01 NOTE — ED Notes (Addendum)
Patient drank 8oz orange juice

## 2016-06-01 NOTE — ED Notes (Signed)
Patient given [place] sandwich, cheese sticks and applesauce to eat.

## 2016-06-01 NOTE — ED Notes (Signed)
PA-C at bedside 

## 2016-06-01 NOTE — ED Provider Notes (Signed)
CSN: [id number]     Arrival date & time 05/31/16  2356 History   First MD Initiated Contact with Patient 06/01/16 0033     Chief Complaint  Patient presents with  . Hypoglycemia     (Consider location/radiation/quality/duration/timing/severity/associated sxs/prior Treatment) HPI Comments: Is obese Afro-American female who states that for the past week.  She has had no appetite and she had [place] to no food consumption in last was called to her home after her son found her to be somnolent, difficulty speaking and less responsive than normal. Patient states that she just has had no appetite.  She is trying to diet, although they are food in the freezer.  She didn't feel like she wanted to cut her mouth.  Found that she had a glucose of 34.  They gave her 100 cc of D10 IV and transported her to the emergency department. Patient denies SI or HI depression states she goes to work every day, comes home per her routine watches TV and goes to bed  Patient is a 35 y.o. female presenting with hypoglycemia. The history is provided by the patient and the EMS personnel.  Hypoglycemia Initial blood sugar:  34 Blood sugar after intervention:  160 Severity:  Mild Onset quality:  Unable to specify Timing:  Unable to specify Progression:  Improving Chronicity:  New Diabetic status:  Non-diabetic Context: decreased oral intake and diet changes   Relieved by:  Eating and IV glucose Associated symptoms: decreased responsiveness   Associated symptoms: no dizziness     Past Medical History  Diagnosis Date  . Migraines   . Obesity    History reviewed. No pertinent past surgical history. No family history on file. Social History  Substance Use Topics  . Smoking status: Current Every Day Smoker -- 0.50 packs/day for 18 years    Types: Cigarettes  . Smokeless tobacco: None  . Alcohol Use: 0.0 oz/week    0 Standard drinks or equivalent per week     Comment: Pt admits to a fifth of liquor a day, shes  says she has 3-4 drinks a day   OB History    No data available     Review of Systems  Constitutional: Positive for decreased responsiveness. Negative for fever and chills.  Respiratory: Negative for cough.   Cardiovascular: Negative for chest pain and leg swelling.  Gastrointestinal: Negative for abdominal pain.  Genitourinary: Negative for dysuria.  Skin: Negative for rash.  Neurological: Negative for dizziness and headaches.  All other systems reviewed and are negative.     Allergies  Pataday  Home Medications   Prior to Admission medications   Medication Sig Start Date End Date Taking? Authorizing Provider  aspirin-acetaminophen-caffeine (EXCEDRIN MIGRAINE) [phone] MG tablet Take 1-2 tablets by mouth every 6 (six) hours as needed for headache.   Yes Historical Provider, MD  gabapentin (NEURONTIN) 300 MG capsule Take 1 capsule (300 mg total) by mouth 3 (three) times daily. 01/03/16  Yes Levert FeinsteinYijun Yan, MD  ibuprofen (ADVIL,MOTRIN) 200 MG tablet Take 200 mg by mouth every 6 (six) hours as needed for mild pain.   Yes Historical Provider, MD  cephALEXin (KEFLEX) 500 MG capsule Take 1 capsule (500 mg total) by mouth 2 (two) times daily. Patient not taking: Reported on 06/01/2016 08/27/15   Tomasita CrumbleAdeleke Oni, MD  doxycycline (VIBRAMYCIN) 100 MG capsule Take 1 capsule (100 mg total) by mouth 2 (two) times daily. One po bid x 7 days Patient not taking: Reported on 06/01/2016 08/27/15  Tomasita Crumble, MD   BP 124/77 mmHg  Pulse 93  Temp(Src) 97.6 F (36.4 C) (Oral)  Resp 18  SpO2 99% Physical Exam  Constitutional: She is oriented to person, place, and time. She appears well-developed and well-nourished.  HENT:  Head: Normocephalic.  Eyes: Pupils are equal, round, and reactive to light.  Neck: Normal range of motion.  Cardiovascular: Regular rhythm.  Tachycardia present.   Pulmonary/Chest: Effort normal and breath sounds normal.  Musculoskeletal: Normal range of motion.  Neurological: She  is alert and oriented to person, place, and time.  Skin: Skin is warm and dry.  Nursing note and vitals reviewed.   ED Course  Procedures (including critical care time) Labs Review Labs Reviewed  CBG MONITORING, ED  CBG MONITORING, ED  CBG MONITORING, ED    Imaging Review No results found. I have personally reviewed and evaluated these images and lab results as part of my medical decision-making.   EKG Interpretation None    Patient will be continued to be monitored.  She has been given food, although she still says that she has decreased appetite Patient has been observed for an additional 2 hour.  She is maintaining her blood sugar 88.  At discharge  MDM   Final diagnoses:  Hypoglycemia         Earley Favor, NP 06/01/16 0308  Dione Booze, MD 06/01/16 [phone]

## 2016-08-02 ENCOUNTER — Encounter (HOSPITAL_COMMUNITY): Payer: Self-pay | Admitting: *Deleted

## 2016-08-02 ENCOUNTER — Ambulatory Visit (HOSPITAL_COMMUNITY)
Admission: EM | Admit: 2016-08-02 | Discharge: 2016-08-02 | Disposition: A | Discharge status: Home / Self Care | Payer: PRIVATE HEALTH INSURANCE | Attending: Family Medicine | Admitting: Family Medicine

## 2016-08-02 DIAGNOSIS — H00021 Hordeolum internum right upper eyelid: Secondary | ICD-10-CM | POA: Diagnosis not present

## 2016-08-02 DIAGNOSIS — H1013 Acute atopic conjunctivitis, bilateral: Secondary | ICD-10-CM | POA: Diagnosis not present

## 2016-08-02 MED ORDER — AZELASTINE HCL 0.05 % OP SOLN: 1.0000 [drp] | Freq: Two times a day (BID) | OPHTHALMIC | 0 refills | Status: DC

## 2016-08-02 NOTE — ED Provider Notes (Signed)
CSN: [id number]     Arrival date & time 08/02/16  1439 History   First MD Initiated Contact with Patient 08/02/16 1647     Chief Complaint  Patient presents with  . Eye Problem   (Consider location/radiation/quality/duration/timing/severity/associated sxs/prior Treatment) HPI Patient reports that 2 days ago, she woke up with green pus coming from bilateral eyes.  She reports eye itching/ irritation.  She notes that eyelids are stuck together.  Denies congestion, fever, chills, rhinorrhea, sick contacts.  Endorses chronic cough. No recent travel.  She reports that she has pain on the lateral aspect of her eye.  She wears glasses.  She reports her prescription is stable.    Past Medical History:  Diagnosis Date  . Migraines   . Obesity    History reviewed. No pertinent surgical history. History reviewed. No pertinent family history. Social History  Substance Use Topics  . Smoking status: Current Every Day Smoker    Packs/day: 0.50    Years: 18.00    Types: Cigarettes  . Smokeless tobacco: Not on file  . Alcohol use 0.0 oz/week     Comment: Pt admits to a fifth of liquor a day, shes says she has 3-4 drinks a day   OB History    No data available     Review of Systems  Constitutional: Negative for chills and fever.  HENT: Negative for congestion, rhinorrhea, sneezing and sore throat.   Eyes: Positive for discharge, redness and itching. Negative for photophobia and visual disturbance.       Right eye lump  Respiratory: Positive for cough. Negative for shortness of breath.   Neurological: Positive for headaches. Negative for dizziness. Light-headedness: chronic migraine.    Allergies  Pataday [olopatadine hcl]  Home Medications   Prior to Admission medications   Medication Sig Start Date End Date Taking? Authorizing Provider  aspirin-acetaminophen-caffeine (EXCEDRIN MIGRAINE) [phone] MG tablet Take 1-2 tablets by mouth every 6 (six) hours as needed for headache.     Historical Provider, MD  azelastine (OPTIVAR) 0.05 % ophthalmic solution Place 1 drop into both eyes 2 (two) times daily. x7 days 08/02/16   Raliegh IpAshly M Tandrea Kommer, DO  cephALEXin (KEFLEX) 500 MG capsule Take 1 capsule (500 mg total) by mouth 2 (two) times daily. Patient not taking: Reported on 06/01/2016 08/27/15   Tomasita CrumbleAdeleke Oni, MD  doxycycline (VIBRAMYCIN) 100 MG capsule Take 1 capsule (100 mg total) by mouth 2 (two) times daily. One po bid x 7 days Patient not taking: Reported on 06/01/2016 08/27/15   Tomasita CrumbleAdeleke Oni, MD  gabapentin (NEURONTIN) 300 MG capsule Take 1 capsule (300 mg total) by mouth 3 (three) times daily. 01/03/16   Levert FeinsteinYijun Yan, MD  ibuprofen (ADVIL,MOTRIN) 200 MG tablet Take 200 mg by mouth every 6 (six) hours as needed for mild pain.    Historical Provider, MD   Meds Ordered and Administered this Visit  Medications - No data to display  BP 120/82 (BP Location: Right Arm)   Pulse 78   Temp 98.6 F (37 C) (Oral)   Resp 18   SpO2 99%  No data found.   Physical Exam  Constitutional: She appears well-developed. No distress.  obese  Eyes: EOM are normal. Pupils are equal, round, and reactive to light. Lids are everted and swept, no foreign bodies found. Right eye exhibits hordeolum. Right eye exhibits no chemosis and no exudate. Left eye exhibits no chemosis, no exudate and no hordeolum. Right conjunctiva is injected (mild). Left conjunctiva is injected (mild).  Cardiovascular: Normal rate.   Pulmonary/Chest: Effort normal.  Skin: She is not diaphoretic.  Vitals reviewed.  Urgent Care Course   Clinical Course    Procedures (including critical care time)  Labs Review Labs Reviewed - No data to display  Imaging Review No results found.   MDM   1. Hordeolum internum of right upper eyelid   2. Allergic conjunctivitis, bilateral    Deretha Emoryatascha M Nishikawa is a 35 y.o. female that presents to UC for right eye pain.  She appears to have a hordeolum of the right upper lid in the setting  of allergic conjunctivitis.  No evidence of bacterial conjunctivitis on my exam.  We discussed applying warm compresses 4 times daily to affected eye to reduce swelling.  She was also prescribed an opthalmologic antihistamine.  I discussed not using for longer than 1 week at a time to reduce the risk of rebound opthalmologic irritation.  Return precautions were reviewed.  All questions answered.  Meds ordered this encounter  Medications  . azelastine (OPTIVAR) 0.05 % ophthalmic solution    Sig: Place 1 drop into both eyes 2 (two) times daily. x7 days    Dispense:  6 mL    Refill:  0    This patient was discussed with Dr Jarvis NewcomerGrunz, who agrees with my assessment and plan.  Linden Mikes M. Nadine CountsGottschalk, DO PGY-3, [facility]      Raliegh IpAshly M Jacqueline Spofford, DO 08/02/16 [phone]

## 2016-08-02 NOTE — ED Triage Notes (Signed)
Pt  Woke  Up  Several  Days  Ago with  irriated  Burning  Eyes  The  Symptoms  Are worse I  The  Early   Am  She  Has  Been  Applying  wrm  Compresses  To the  Area

## 2017-08-19 ENCOUNTER — Inpatient Hospital Stay (HOSPITAL_COMMUNITY)
Admission: AD | Admit: 2017-08-19 | Discharge: 2017-08-19 | Disposition: A | Discharge status: Home / Self Care | Payer: 59 | Source: Ambulatory Visit | Attending: Obstetrics & Gynecology | Admitting: Obstetrics & Gynecology

## 2017-08-19 ENCOUNTER — Encounter (HOSPITAL_COMMUNITY): Payer: Self-pay | Admitting: Student

## 2017-08-19 DIAGNOSIS — R109 Unspecified abdominal pain: Secondary | ICD-10-CM | POA: Diagnosis not present

## 2017-08-19 DIAGNOSIS — Z3202 Encounter for pregnancy test, result negative: Secondary | ICD-10-CM

## 2017-08-19 DIAGNOSIS — F17210 Nicotine dependence, cigarettes, uncomplicated: Secondary | ICD-10-CM | POA: Insufficient documentation

## 2017-08-19 DIAGNOSIS — G43009 Migraine without aura, not intractable, without status migrainosus: Secondary | ICD-10-CM | POA: Diagnosis not present

## 2017-08-19 DIAGNOSIS — E669 Obesity, unspecified: Secondary | ICD-10-CM | POA: Insufficient documentation

## 2017-08-19 LAB — CBC
HEMATOCRIT: 40.8 % (ref 36.0–46.0)
Hemoglobin: 13.9 g/dL (ref 12.0–15.0)
MCH: 34 pg (ref 26.0–34.0)
MCHC: 34.1 g/dL (ref 30.0–36.0)
MCV: 99.8 fL (ref 78.0–100.0)
PLATELETS: 166 10*3/uL (ref 150–400)
RBC: 4.09 MIL/uL (ref 3.87–5.11)
RDW: 14 % (ref 11.5–15.5)
WBC: 7.6 10*3/uL (ref 4.0–10.5)

## 2017-08-19 LAB — URINALYSIS, ROUTINE W REFLEX MICROSCOPIC
BILIRUBIN URINE: NEGATIVE
Glucose, UA: NEGATIVE mg/dL
KETONES UR: NEGATIVE mg/dL
LEUKOCYTES UA: NEGATIVE
NITRITE: NEGATIVE
PH: 6 (ref 5.0–8.0)
Protein, ur: NEGATIVE mg/dL
SPECIFIC GRAVITY, URINE: 1.005 (ref 1.005–1.030)

## 2017-08-19 LAB — POCT PREGNANCY, URINE: Preg Test, Ur: NEGATIVE

## 2017-08-19 LAB — HCG, QUANTITATIVE, PREGNANCY: hCG, Beta Chain, Quant, S: <1 m[IU]/mL (ref <5)

## 2017-08-19 NOTE — MAU Note (Addendum)
Pt. Here for abd. Pain that started this morning around 10:00, sharp, stabbing pain. No vaginal discharge, no vaginal bleeding. Pt. Did not self mediate. Pt. Stated she took four pregnancy test within the last week and all were positive.

## 2017-08-19 NOTE — MAU Provider Note (Signed)
History     CSN: [id number]  Arrival date and time: 08/19/17 1530  First Provider Initiated Contact with Patient 08/19/17 1618      Chief Complaint  Patient presents with  . Abdominal Pain   HPI Sonya Hurst is a 36 y.o. G42P4004 female who presents with abdominal pain & possible pregnancy. States she had 4 positive pregnancy tests at home within the last week; most recent test was taken this morning. States she had essure done 10 years ago after having her youngest son.  Reports upper mid abdominal pain this morning. Pain has not continued. Denies lower abdominal pain, or vaginal bleeding.   Past Medical History:  Diagnosis Date  . Migraines   . Obesity     Past Surgical History:  Procedure Laterality Date  . ESSURE TUBAL LIGATION  2008    No family history on file.  Social History  Substance Use Topics  . Smoking status: Current Every Day Smoker    Packs/day: 0.50    Years: 18.00    Types: Cigarettes  . Smokeless tobacco: Not on file  . Alcohol use 0.0 oz/week     Comment: Pt admits to a fifth of liquor a day, shes says she has 3-4 drinks a day    Allergies:  Allergies  Allergen Reactions  . Pataday [Olopatadine Hcl] Swelling    Prescriptions Prior to Admission  Medication Sig Dispense Refill Last Dose  . calcium carbonate (TUMS - DOSED IN MG ELEMENTAL CALCIUM) 500 MG chewable tablet Chew 2 tablets by mouth daily as needed for indigestion or heartburn.   Past Month at Unknown time    Review of Systems  Constitutional: Negative.   Gastrointestinal: Positive for abdominal pain. Negative for nausea and vomiting.  Genitourinary: Negative.    Physical Exam   Blood pressure 131/73, pulse (!) 105, temperature 98.8 F (37.1 C), temperature source Oral, resp. rate 18, height 5\' 4"  (1.626 m), weight (!) 310 lb (140.6 kg), SpO2 100 %.  Physical Exam  Nursing note and vitals reviewed. Constitutional: She is oriented to person, place, and time. She appears  well-developed and well-nourished. No distress.  HENT:  Head: Normocephalic and atraumatic.  Eyes: Conjunctivae are normal. Right eye exhibits no discharge. Left eye exhibits no discharge. No scleral icterus.  Neck: Normal range of motion.  Respiratory: Effort normal. No respiratory distress.  GI: Soft. There is no tenderness.  Neurological: She is alert and oriented to person, place, and time.  Skin: Skin is warm and dry. She is not diaphoretic.  Psychiatric: She has a normal mood and affect. Her behavior is normal. Judgment and thought content normal.    MAU Course  Procedures Results for orders placed or performed during the hospital encounter of 08/19/17 (from the past 24 hour(s))  Urinalysis, Routine w reflex microscopic (not at [facility])     Status: Abnormal   Collection Time: 08/19/17  3:32 PM  Result Value Ref Range   Color, Urine YELLOW YELLOW   APPearance HAZY (A) CLEAR   Specific Gravity, Urine 1.005 1.005 - 1.030   pH 6.0 5.0 - 8.0   Glucose, UA NEGATIVE NEGATIVE mg/dL   Hgb urine dipstick SMALL (A) NEGATIVE   Bilirubin Urine NEGATIVE NEGATIVE   Ketones, ur NEGATIVE NEGATIVE mg/dL   Protein, ur NEGATIVE NEGATIVE mg/dL   Nitrite NEGATIVE NEGATIVE   Leukocytes, UA NEGATIVE NEGATIVE   RBC / HPF 0-5 0 - 5 RBC/hpf   WBC, UA 0-5 0 - 5 WBC/hpf  Bacteria, UA RARE (A) NONE SEEN   Squamous Epithelial / LPF 6-30 (A) NONE SEEN   Mucus PRESENT   Pregnancy, urine POC     Status: None   Collection Time: 08/19/17  3:59 PM  Result Value Ref Range   Preg Test, Ur NEGATIVE NEGATIVE  CBC     Status: None   Collection Time: 08/19/17  4:37 PM  Result Value Ref Range   WBC 7.6 4.0 - 10.5 K/uL   RBC 4.09 3.87 - 5.11 MIL/uL   Hemoglobin 13.9 12.0 - 15.0 g/dL   HCT 16.140.8 09.636.0 - 04.546.0 %   MCV 99.8 78.0 - 100.0 fL   MCH 34.0 26.0 - 34.0 pg   MCHC 34.1 30.0 - 36.0 g/dL   RDW 40.914.0 81.111.5 - 91.415.5 %   Platelets 166 150 - 400 K/uL  hCG, quantitative, pregnancy     Status: None   Collection  Time: 08/19/17  4:37 PM  Result Value Ref Range   hCG, Beta Chain, Quant, S <1 <5 mIU/mL    MDM UPT negative HCG <1  Assessment and Plan  A: 1. Pregnancy examination or test, negative result    P: Discharge home Discussed reasons to return to MAU F/u with PCP as needed  Judeth HornErin Nalani Andreen 08/19/2017, 4:18 PM

## 2018-12-27 ENCOUNTER — Other Ambulatory Visit (HOSPITAL_COMMUNITY): Payer: Self-pay | Admitting: *Deleted

## 2018-12-27 ENCOUNTER — Other Ambulatory Visit: Payer: Self-pay | Admitting: *Deleted

## 2018-12-27 DIAGNOSIS — R109 Unspecified abdominal pain: Secondary | ICD-10-CM

## 2019-01-03 ENCOUNTER — Ambulatory Visit (HOSPITAL_COMMUNITY)
Admission: RE | Admit: 2019-01-03 | Discharge: 2019-01-03 | Disposition: A | Discharge status: Home / Self Care | Payer: 59 | Source: Ambulatory Visit | Attending: *Deleted | Admitting: *Deleted

## 2019-01-03 DIAGNOSIS — R109 Unspecified abdominal pain: Secondary | ICD-10-CM | POA: Insufficient documentation

## 2019-01-03 MED ORDER — IOHEXOL 300 MG/ML  SOLN
100.0000 mL | Freq: Once | INTRAMUSCULAR | Status: AC | PRN
  Administered 2019-01-03: 100 mL via INTRAVENOUS

## 2019-01-06 ENCOUNTER — Other Ambulatory Visit: Payer: Self-pay

## 2019-01-06 ENCOUNTER — Encounter (HOSPITAL_COMMUNITY): Payer: Self-pay | Admitting: Emergency Medicine

## 2019-01-06 ENCOUNTER — Emergency Department (HOSPITAL_COMMUNITY)
Admission: EM | Admit: 2019-01-06 | Discharge: 2019-01-06 | Disposition: A | Discharge status: Home / Self Care | Payer: BLUE CROSS/BLUE SHIELD | Attending: Emergency Medicine | Admitting: Emergency Medicine

## 2019-01-06 DIAGNOSIS — F17210 Nicotine dependence, cigarettes, uncomplicated: Secondary | ICD-10-CM | POA: Insufficient documentation

## 2019-01-06 DIAGNOSIS — Z87898 Personal history of other specified conditions: Secondary | ICD-10-CM

## 2019-01-06 DIAGNOSIS — R55 Syncope and collapse: Secondary | ICD-10-CM | POA: Insufficient documentation

## 2019-01-06 LAB — CBC WITH DIFFERENTIAL/PLATELET
ABS IMMATURE GRANULOCYTES: 0.03 10*3/uL (ref 0.00–0.07)
Basophils Absolute: 0 10*3/uL (ref 0.0–0.1)
Basophils Relative: 0 %
Eosinophils Absolute: 0.1 10*3/uL (ref 0.0–0.5)
Eosinophils Relative: 1 %
HCT: 41.5 % (ref 36.0–46.0)
Hemoglobin: 13.2 g/dL (ref 12.0–15.0)
Immature Granulocytes: 0 %
Lymphocytes Relative: 45 %
Lymphs Abs: 3.7 10*3/uL (ref 0.7–4.0)
MCH: 31.8 pg (ref 26.0–34.0)
MCHC: 31.8 g/dL (ref 30.0–36.0)
MCV: 100 fL (ref 80.0–100.0)
MONOS PCT: 6 %
Monocytes Absolute: 0.5 10*3/uL (ref 0.1–1.0)
Neutro Abs: 4 10*3/uL (ref 1.7–7.7)
Neutrophils Relative %: 48 %
Platelets: 176 10*3/uL (ref 150–400)
RBC: 4.15 MIL/uL (ref 3.87–5.11)
RDW: 14.1 % (ref 11.5–15.5)
WBC: 8.3 10*3/uL (ref 4.0–10.5)
nRBC: 0 % (ref 0.0–0.2)

## 2019-01-06 LAB — URINALYSIS, ROUTINE W REFLEX MICROSCOPIC
Bilirubin Urine: NEGATIVE
Glucose, UA: NEGATIVE mg/dL
HGB URINE DIPSTICK: NEGATIVE
Ketones, ur: NEGATIVE mg/dL
LEUKOCYTES UA: NEGATIVE
NITRITE: NEGATIVE
PROTEIN: NEGATIVE mg/dL
Specific Gravity, Urine: 1.003 — ABNORMAL LOW (ref 1.005–1.030)
pH: 6 (ref 5.0–8.0)

## 2019-01-06 LAB — PROTIME-INR
INR: 0.95
Prothrombin Time: 12.6 seconds (ref 11.4–15.2)

## 2019-01-06 LAB — COMPREHENSIVE METABOLIC PANEL
ALT: 21 U/L (ref 0–44)
AST: 16 U/L (ref 15–41)
Albumin: 3.2 g/dL — ABNORMAL LOW (ref 3.5–5.0)
Alkaline Phosphatase: 73 U/L (ref 38–126)
Anion gap: 10 (ref 5–15)
BUN: <5 mg/dL — ABNORMAL LOW (ref 6–20)
CO2: 21 mmol/L — ABNORMAL LOW (ref 22–32)
Calcium: 8.4 mg/dL — ABNORMAL LOW (ref 8.9–10.3)
Chloride: 110 mmol/L (ref 98–111)
Creatinine, Ser: 0.87 mg/dL (ref 0.44–1.00)
GFR calc Af Amer: >60 mL/min (ref >60)
Glucose, Bld: 98 mg/dL (ref 70–99)
Potassium: 4.1 mmol/L (ref 3.5–5.1)
Sodium: 141 mmol/L (ref 135–145)
Total Bilirubin: 0.3 mg/dL (ref 0.3–1.2)
Total Protein: 6.9 g/dL (ref 6.5–8.1)

## 2019-01-06 LAB — PREGNANCY, URINE: Preg Test, Ur: NEGATIVE

## 2019-01-06 LAB — LIPASE, BLOOD: Lipase: 24 U/L (ref 11–51)

## 2019-01-06 LAB — I-STAT TROPONIN, ED: Troponin i, poc: 0.01 ng/mL (ref 0.00–0.08)

## 2019-01-06 NOTE — ED Provider Notes (Signed)
[facility] Provider Note   CSN: [id number] Arrival date & time: 01/06/19  1941     History   Chief Complaint No chief complaint on file.   HPI Sonya Hurst is a 38 y.o. female.  HPI Patient reports that she has had persistent right flank pain since October.  He indicates an area in her thoracic back from about T9-T12 and then comes forward slightly to about the mid axillary line.  This is been persistent without associated symptoms.  Patient had a outpatient CT scan scheduled by her primary provider.  This was done approximately 3 days ago.  The findings were negative.  Patient reports that she was very happy that it was negative although is hard for her to believe because she still has pain.  She reports therefore she was making a big dinner for guests and had set up the plates and then sat down and without much warning her hands got sweaty and then she reports that she "fell out".  She reports that she went out completely.  She had guests present she reports that she went on to the floor.  She denies any injury.  She reports they had to call EMS to get her back up off of the floor.  She reports she did not have any headache or chest pain before the episode but afterward she reports she got a pressure in the center of her chest.  No difficulty breathing, no vomiting. Past Medical History:  Diagnosis Date  . Migraines   . Obesity     Patient Active Problem List   Diagnosis Date Noted  . Right sided weakness 01/03/2016  . Dysarthria 01/03/2016    Past Surgical History:  Procedure Laterality Date  . ESSURE TUBAL LIGATION  2008     OB History    Gravida  4   Para  4   Term  4   Preterm      AB      Living  4     SAB      TAB      Ectopic      Multiple      Live Births  4            Home Medications    Prior to Admission medications   Medication Sig Start Date End Date Taking? Authorizing Provider  calcium  carbonate (TUMS - DOSED IN MG ELEMENTAL CALCIUM) 500 MG chewable tablet Chew 2 tablets by mouth daily as needed for indigestion or heartburn.    [provider]    Family History No family history on file.  Social History Social History   Tobacco Use  . Smoking status: Current Every Day Smoker    Packs/day: 0.50    Years: 18.00    Pack years: 9.00    Types: Cigarettes  Substance Use Topics  . Alcohol use: Yes    Alcohol/week: 0.0 standard drinks    Comment: Pt admits to a fifth of liquor a day, shes says she has 3-4 drinks a day  . Drug use: No     Allergies   Pataday [olopatadine hcl]   Review of Systems Review of Systems 10 Systems reviewed and are negative for acute change except as noted in the HPI.   Physical Exam Updated Vital Signs BP 109/71   Pulse 97   Temp 98.3 F (36.8 C) (Oral)   Resp 20   Ht 5\' 5"  (1.651 m)  Wt (!) 166.5 kg   SpO2 100%   BMI 61.07 kg/m   Physical Exam Constitutional:      Comments: Patient is alert and nontoxic.  She is in no acute distress.  No respiratory distress.  Morbid obesity.  HENT:     Head: Normocephalic and atraumatic.     Nose: Nose normal.     Mouth/Throat:     Mouth: Mucous membranes are moist.  Eyes:     Extraocular Movements: Extraocular movements intact.  Neck:     Musculoskeletal: Neck supple.  Cardiovascular:     Rate and Rhythm: Normal rate and regular rhythm.     Pulses: Normal pulses.     Heart sounds: Normal heart sounds.  Pulmonary:     Effort: Pulmonary effort is normal.     Breath sounds: Normal breath sounds.     Comments: Palpation of the patient's posterior right chest wall from about the ninth to the 12th rib at about the midline of the back is exquisitely tender to palpation in an oval area that wraps about to the mid axillary line.  Patient winces and reports severe pain with palpation of the soft tissues.  The skin is normal.  There is no induration or erythema.  There is no  rashes.  Patient has large amount of adipose tissue and her pain is elicited by gentle touch of the soft tissues that would not even place pressure on the thoracic wall. Abdominal:     Comments: Abdomen soft nontender.  Patient does not endorse any pain to deep palpation in the right upper quadrant, lower quadrant or epigastrium.  Musculoskeletal: Normal range of motion.        General: No swelling or tenderness.     Right lower leg: No edema.     Left lower leg: No edema.  Skin:    General: Skin is warm and dry.  Neurological:     General: No focal deficit present.     Mental Status: She is oriented to person, place, and time.     Coordination: Coordination normal.  Psychiatric:        Mood and Affect: Mood normal.      ED Treatments / Results  Labs (all labs ordered are listed, but only abnormal results are displayed) Labs Reviewed  URINALYSIS, ROUTINE W REFLEX MICROSCOPIC - Abnormal; Notable for the following components:      Result Value   Color, Urine STRAW (*)    Specific Gravity, Urine 1.003 (*)    All other components within normal limits  COMPREHENSIVE METABOLIC PANEL - Abnormal; Notable for the following components:   CO2 21 (*)    BUN <5 (*)    Calcium 8.4 (*)    Albumin 3.2 (*)    All other components within normal limits  PREGNANCY, URINE  LIPASE, BLOOD  CBC WITH DIFFERENTIAL/PLATELET  PROTIME-INR  I-STAT TROPONIN, ED    EKG EKG Interpretation  Date/Time:  Monday January 06 2019 20:41:19 EST Ventricular Rate:  92 PR Interval:    QRS Duration: 89 QT Interval:  377 QTC Calculation: 467 R Axis:   40 Text Interpretation:  Sinus rhythm normal, no change from present. Confirmed by Arby Barrette [phone]) on 01/06/2019 10:23:35 PM Also confirmed by Arby Barrette [phone]), editor Sheppard Evens ([id number])  on 01/07/2019 7:13:01 AM   Radiology No results found.  Procedures Procedures (including critical care time)  Medications Ordered in ED Medications -  No data to display   Initial Impression /  Assessment and Plan / ED Course  I have reviewed the triage vital signs and the nursing notes.  Pertinent labs & imaging results that were available during my care of the patient were reviewed by me and considered in my medical decision making (see chart for details).    Diagnostic evaluation within normal limits.  No evidence of acute cardiac ischemic event.  EKG unchanged from previous, troponin negative.  Patient is alert and appropriate.  She is asymptomatic.  She has had longstanding right flank pain with recent CT abdomen contrast with no acute findings.  Pain is reproducible just to light palpation of soft tissue  Do not have suspicion for significant intrathoracic etiology.  Patient does not have right upper quadrant tenderness and recent CT does not show gallbladder wall thickening or stones.  At this time, I feel patient stable for discharge.  She is instructed to follow-up with her primary care doctor for further referral as needed.  Return precautions reviewed.  Final Clinical Impressions(s) / ED Diagnoses   Final diagnoses:  Syncope, unspecified syncope type  Personal history of right flank pain    ED Discharge Orders    None       Arby BarrettePfeiffer, Joon Pohle, MD 01/08/19 1610

## 2019-01-06 NOTE — Discharge Instructions (Addendum)
1.  See your family doctor this week for recheck. 2.  Return to the emergency department if you feel you are getting lightheaded if you have episodes of close to passing out again or other concerning symptoms.

## 2019-01-06 NOTE — ED Triage Notes (Signed)
Pt brought in by [facility] from home. Pt complains of right sided flank pain.

## 2019-01-06 NOTE — ED Notes (Signed)
Patient verbalizes understanding of discharge instructions. Opportunity for questioning and answers were provided. Armband removed by staff, pt discharged from ED ambulatory to home.  

## 2019-01-16 ENCOUNTER — Emergency Department (HOSPITAL_COMMUNITY)
Admission: EM | Admit: 2019-01-16 | Discharge: 2019-01-17 | Disposition: A | Discharge status: Home / Self Care | Payer: BLUE CROSS/BLUE SHIELD | Attending: Emergency Medicine | Admitting: Emergency Medicine

## 2019-01-16 ENCOUNTER — Other Ambulatory Visit: Payer: Self-pay

## 2019-01-16 ENCOUNTER — Encounter (HOSPITAL_COMMUNITY): Payer: Self-pay

## 2019-01-16 DIAGNOSIS — Z5321 Procedure and treatment not carried out due to patient leaving prior to being seen by health care provider: Secondary | ICD-10-CM | POA: Diagnosis not present

## 2019-01-16 DIAGNOSIS — F10929 Alcohol use, unspecified with intoxication, unspecified: Secondary | ICD-10-CM | POA: Insufficient documentation

## 2019-01-16 NOTE — ED Notes (Signed)
Patient stated, "I have my cadillac here and I am going to [facility]."

## 2019-01-16 NOTE — ED Triage Notes (Signed)
Per EMS- Patient's mother stopped to bring the patient some food and found the patient lying on the floor. Patient told EMS she last had a drink last night,but strong smell of ETOH.

## 2019-01-16 NOTE — ED Notes (Signed)
PT ambulated to registration, stating that we need to call [facility] to take her to [facility].

## 2020-05-17 IMAGING — CT CT ABD-PELV W/ CM
2 of 4 series · 16 of 46 positions shown, 18 images · IV contrast (Omni 300)
Comparison: 08/27/2015

CLINICAL DATA: Abdominal and low back pain.

EXAM:
CT ABDOMEN AND PELVIS WITH CONTRAST
TECHNIQUE: Multidetector CT imaging of the abdomen and pelvis was performed
using the standard protocol following bolus administration of
intravenous contrast.
CONTRAST:  100mL OMNIPAQUE IOHEXOL 300 MG/ML  SOLN

[Series 3: a/p w/ 5mm · axial · 0.82mm/px · z∈[+649,+1084]mm · 13 of 95 slices shown, 15 images]
[im 4/95  soft-tissue]
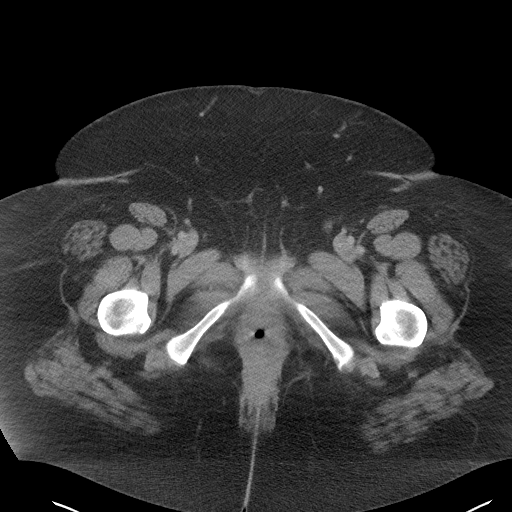
[im 4/95  bone]
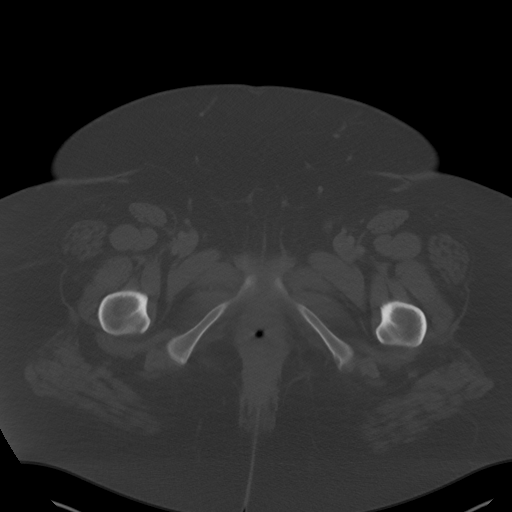
[im 12/95  soft-tissue]
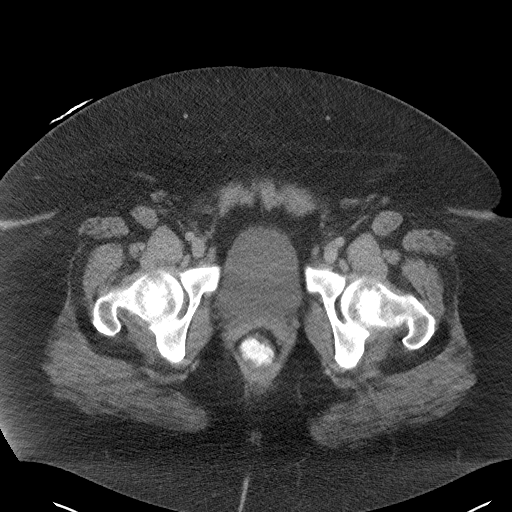
[im 20/95  soft-tissue]
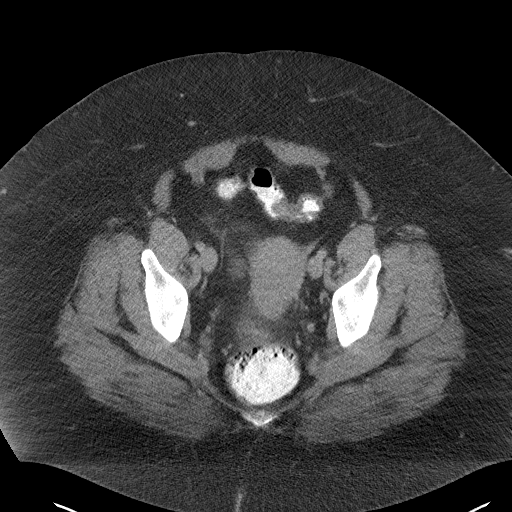
[im 28/95  soft-tissue]
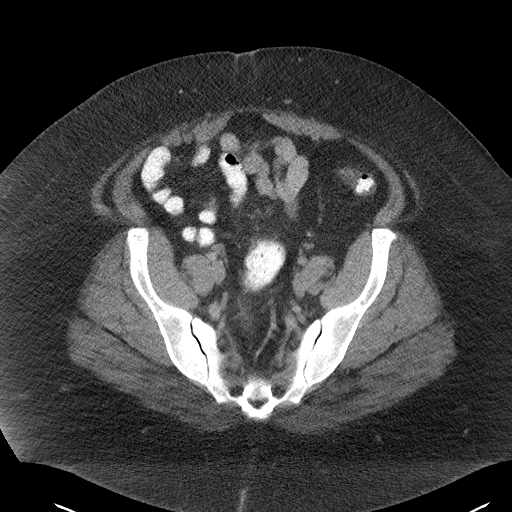
[im 32/95  soft-tissue]
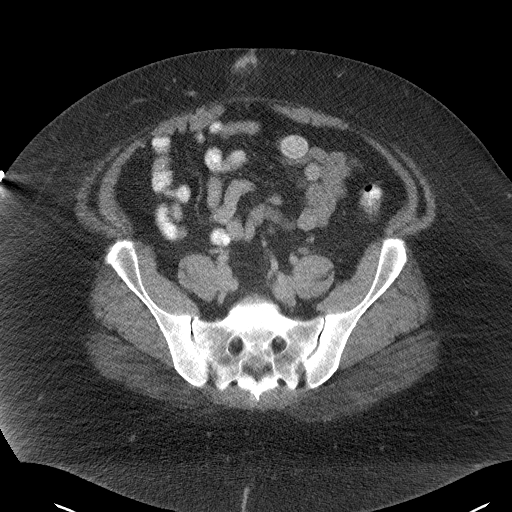
[im 40/95  soft-tissue]
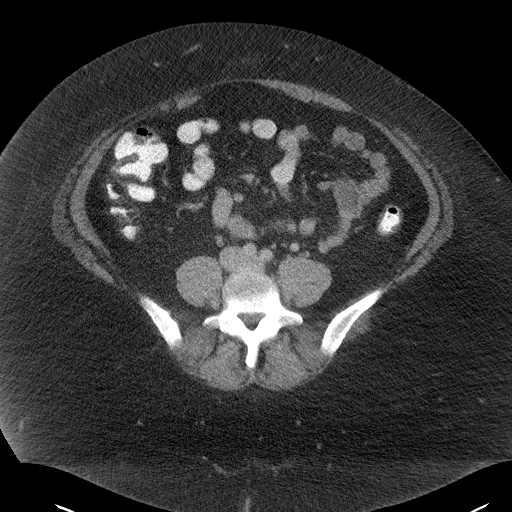
[im 48/95  soft-tissue]
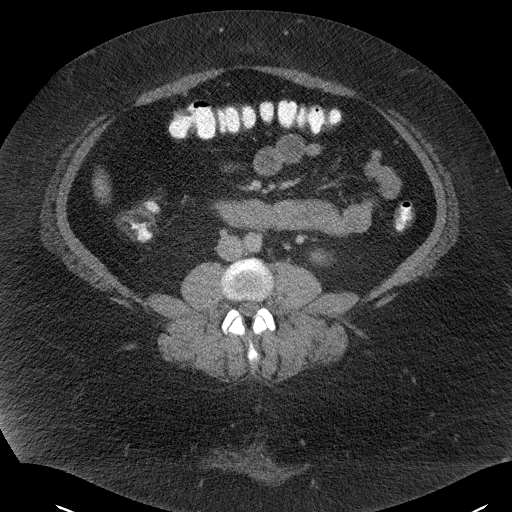
[im 55/95  soft-tissue]
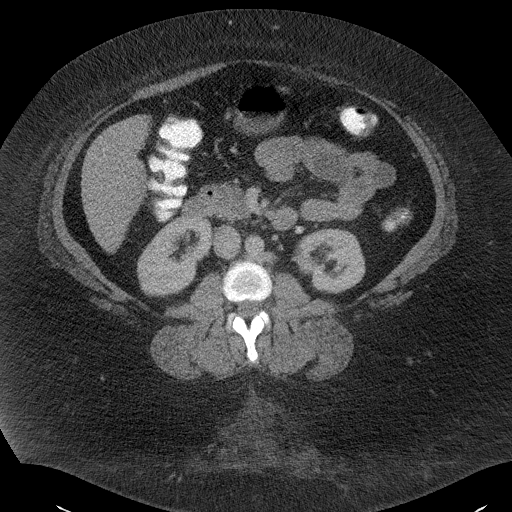
[im 63/95  soft-tissue]
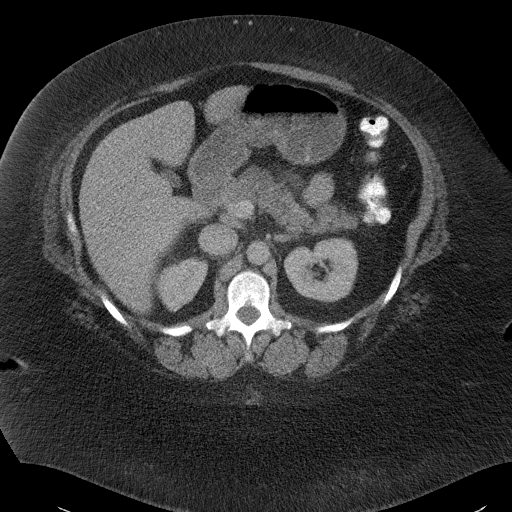
[im 63/95  bone]
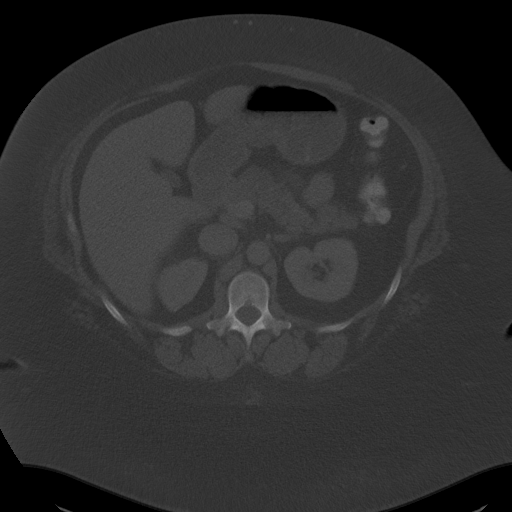
[im 67/95  soft-tissue]
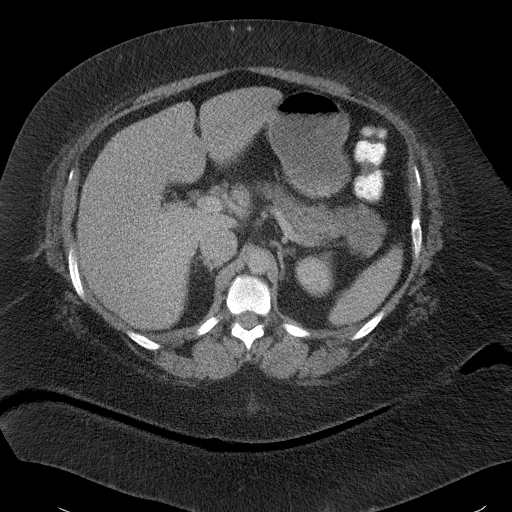
[im 75/95  soft-tissue]
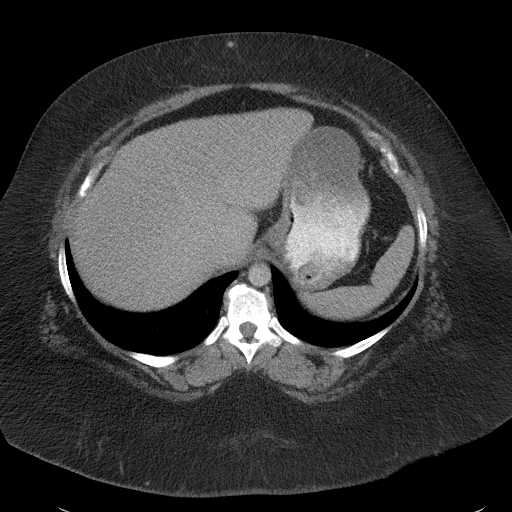
[im 83/95  soft-tissue]
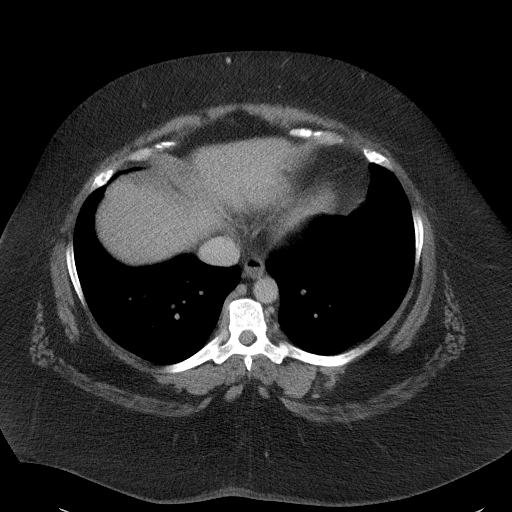
[im 91/95  soft-tissue]
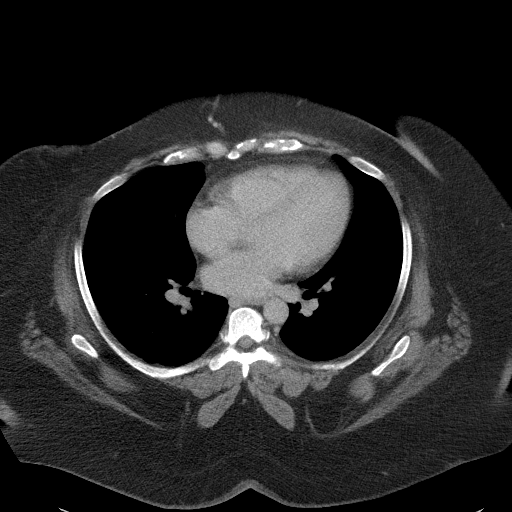

[Series 6: a/p w/ cor · coronal · 0.77mm/px · 3 of 162 slices shown]
[im 54/162  soft-tissue]
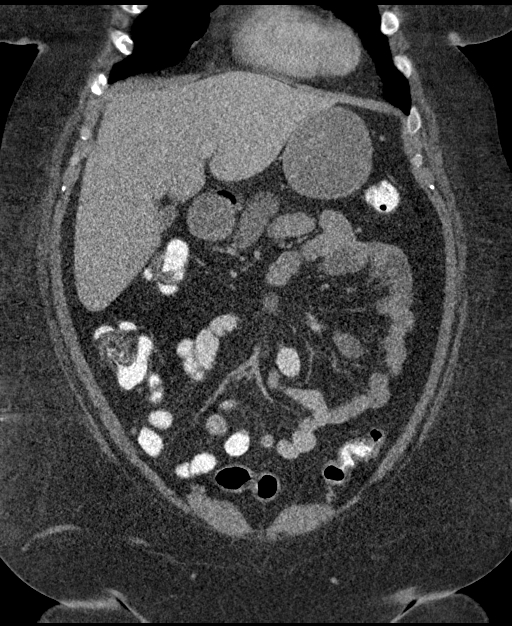
[im 72/162  soft-tissue]
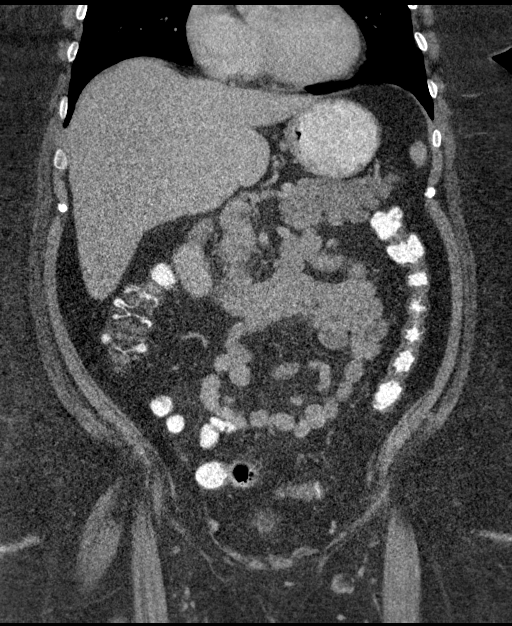
[im 90/162  soft-tissue]
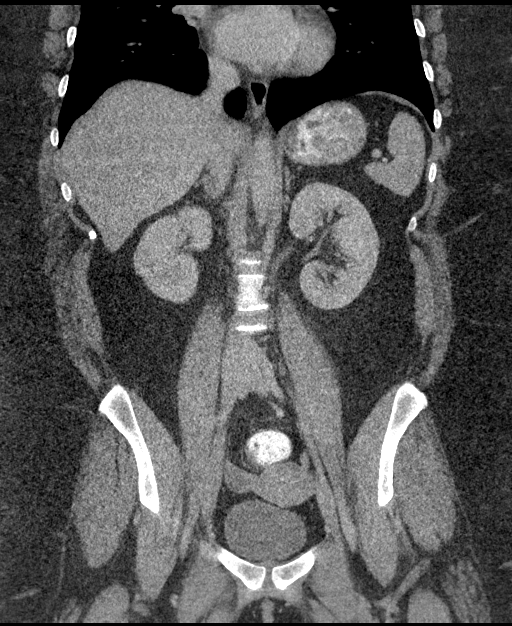

[16 of 46 positions shown; findings below may reference images not displayed]

FINDINGS: Lower Chest: No acute findings.

Hepatobiliary: No hepatic masses identified. Gallbladder is
unremarkable. No evidence of biliary ductal dilatation.

Pancreas:  No mass or inflammatory changes.

Spleen: Within normal limits in size and appearance.

Adrenals/Urinary Tract: No masses identified. No evidence of
urolithiasis or hydronephrosis.

Stomach/Bowel: No evidence of obstruction, inflammatory process or
abnormal fluid collections. Normal appendix visualized.

Vascular/Lymphatic: No pathologically enlarged lymph nodes. No
abdominal aortic aneurysm. Incidentally noted retroaortic left renal
vein.

Reproductive: Bilateral Essure microinserts remain in stable
position. Normal appearance of uterus. No adnexal mass or other
significant abnormality identified.

Other:  None.

Musculoskeletal:  No suspicious bone lesions identified.
IMPRESSION: Negative. No acute findings or other significant abnormality.

## 2021-11-09 ENCOUNTER — Ambulatory Visit (HOSPITAL_COMMUNITY)
Admission: RE | Admit: 2021-11-09 | Discharge: 2021-11-09 | Disposition: A | Discharge status: Home / Self Care | Payer: 59 | Source: Ambulatory Visit | Attending: Urgent Care | Admitting: Urgent Care

## 2021-11-09 ENCOUNTER — Other Ambulatory Visit: Payer: Self-pay

## 2021-11-09 ENCOUNTER — Other Ambulatory Visit (HOSPITAL_COMMUNITY): Payer: Self-pay | Admitting: Urgent Care

## 2021-11-09 ENCOUNTER — Other Ambulatory Visit: Payer: Self-pay | Admitting: Urgent Care

## 2021-11-09 DIAGNOSIS — R1011 Right upper quadrant pain: Secondary | ICD-10-CM

## 2022-05-17 ENCOUNTER — Ambulatory Visit: Payer: Self-pay

## 2022-05-17 NOTE — Telephone Encounter (Signed)
  Chief Complaint: Numbness in hands feet and face. Symptoms: Numbness Frequency: Since 03/25/2022 Pertinent Negatives: Patient denies chest pain, blanace issues weakness Disposition: [] ED /[x] Urgent Care (no appt availability in office) / [] Appointment(In office/virtual)/ []  [facility]/ [] Home Care/ [] Refused Recommended Disposition /[] [facility]/ []  Follow-up with PCP Additional Notes: Pt was seen at an UC for UTI and given an antibiotic for same. Pt states that antibiotic started with "nitro" , medicine was possibly Nitrofurantoin. Since then pt reports this numbness of hands face, and legs. Theses side effects are listed as possible with this medication. Pt also reports a 50 lb weight loss with no change in eating habits or exercise.  Reason for Disposition  [1] Numbness or tingling in one or both hands AND [2] is a chronic symptom (recurrent or ongoing AND present > 4 weeks)  Answer Assessment - Initial Assessment Questions 1. SYMPTOM: "What is the main symptom you are concerned about?" (e.g., weakness, numbness)     *No Answer* 2. ONSET: "When did this start?" (minutes, hours, days; while sleeping)     2-3 weeks 3. LAST NORMAL: "When was the last time you (the patient) were normal (no symptoms)?"     *No Answer* 4. PATTERN "Does this come and go, or has it been constant since it started?"  "Is it present now?"     constant 5. CARDIAC SYMPTOMS: "Have you had any of the following symptoms: chest pain, difficulty breathing, palpitations?"     Difficulty breathing - high heart rate 6. NEUROLOGIC SYMPTOMS: "Have you had any of the following symptoms: headache, dizziness, vision loss, double vision, changes in speech, unsteady on your feet?"     Nope 7. OTHER SYMPTOMS: "Do you have any other symptoms?"     *No Answer* 8. PREGNANCY: "Is there any chance you are pregnant?" "When was your last menstrual period?"     *No Answer*  Protocols used: Neurologic  Deficit-A-AH

## 2022-05-18 ENCOUNTER — Ambulatory Visit
Admission: RE | Admit: 2022-05-18 | Discharge: 2022-05-18 | Disposition: A | Discharge status: Home / Self Care | Payer: 59 | Source: Ambulatory Visit | Attending: Family Medicine | Admitting: Family Medicine

## 2022-05-18 VITALS — BP 149/98 | HR 103 | Temp 98.0°F | Resp 18

## 2022-05-18 DIAGNOSIS — I10 Essential (primary) hypertension: Secondary | ICD-10-CM | POA: Diagnosis not present

## 2022-05-18 DIAGNOSIS — R200 Anesthesia of skin: Secondary | ICD-10-CM

## 2022-05-18 MED ORDER — LISINOPRIL 5 MG PO TABS: 5.0000 mg | ORAL_TABLET | Freq: Every day | ORAL | 3 refills | Status: DC

## 2022-05-18 MED ORDER — AMLODIPINE BESYLATE 5 MG PO TABS: 5.0000 mg | ORAL_TABLET | Freq: Every day | ORAL | 3 refills | Status: DC

## 2022-05-18 NOTE — ED Provider Notes (Signed)
[facility]    CSN: [id number] Arrival date & time: 05/18/22  1044      History   Chief Complaint Chief Complaint  Patient presents with   Hand Problem    Hand numbness - Entered by patient   foot numbness    HPI [place] Geary is a 41 y.o. female.   HPI Here for numbness that began 3 weeks ago.  She has it in her central face, in both hands and both feet.  She states it began after she took a course of nitrofurantoin for UTI.  No syncope or headache.  So is on propranolol for hypertension.  Her blood pressure has remained elevated when she checks it at home--160-170 over 90s to 100.  Past Medical History:  Diagnosis Date   Migraines    Obesity     Patient Active Problem List   Diagnosis Date Noted   Right sided weakness 01/03/2016   Dysarthria 01/03/2016    Past Surgical History:  Procedure Laterality Date   ESSURE TUBAL LIGATION  2008    OB History     Gravida  4   Para  4   Term  4   Preterm      AB      Living  4      SAB      IAB      Ectopic      Multiple      Live Births  4            Home Medications    Prior to Admission medications   Medication Sig Start Date End Date Taking? Authorizing Provider  calcium carbonate (TUMS - DOSED IN MG ELEMENTAL CALCIUM) 500 MG chewable tablet Chew 2 tablets by mouth daily as needed for indigestion or heartburn.    [provider]  nitrofurantoin (MACRODANTIN) 100 MG capsule Take 100 mg by mouth every 12 (twelve) hours. 05/04/22   [provider]  phentermine (ADIPEX-P) 37.5 MG tablet Take 37.5 mg by mouth every morning. 01/10/22   [provider]  propranolol ER (INDERAL LA) 60 MG 24 hr capsule Take 60 mg by mouth daily. 03/23/22   [provider]    Family History History reviewed. No pertinent family history.  Social History Social History   Tobacco Use   Smoking status: Every Day    Packs/day: 0.50    Years: 18.00    Pack years:  9.00    Types: Cigarettes   Smokeless tobacco: Current  Vaping Use   Vaping Use: Never used  Substance Use Topics   Alcohol use: Yes    Alcohol/week: 0.0 standard drinks   Drug use: No     Allergies   Pataday [olopatadine hcl]   Review of Systems Review of Systems   Physical Exam Triage Vital Signs ED Triage Vitals  Enc Vitals Group     BP 05/18/22 1114 (!) 149/98     Pulse Rate 05/18/22 1114 (!) 103     Resp 05/18/22 1114 18     Temp 05/18/22 1114 98 F (36.7 C)     Temp src --      SpO2 05/18/22 1114 95 %     Weight --      Height --      Head Circumference --      Peak Flow --      Pain Score 05/18/22 1113 0     Pain Loc --  Pain Edu? --      Excl. in GC? --    No data found.  Updated Vital Signs BP (!) 149/98   Pulse (!) 103   Temp 98 F (36.7 C)   Resp 18   SpO2 95%   Visual Acuity Right Eye Distance:   Left Eye Distance:   Bilateral Distance:    Right Eye Near:   Left Eye Near:    Bilateral Near:     Physical Exam Vitals reviewed.  Constitutional:      General: She is not in acute distress.    Appearance: She is not ill-appearing, toxic-appearing or diaphoretic.  HENT:     Mouth/Throat:     Mouth: Mucous membranes are moist.     Pharynx: No oropharyngeal exudate or posterior oropharyngeal erythema.  Eyes:     Extraocular Movements: Extraocular movements intact.     Pupils: Pupils are equal, round, and reactive to light.  Cardiovascular:     Rate and Rhythm: Normal rate and regular rhythm.     Heart sounds: No murmur heard. Pulmonary:     Effort: Pulmonary effort is normal.     Breath sounds: Normal breath sounds.  Musculoskeletal:     Cervical back: Neck supple.  Lymphadenopathy:     Cervical: No cervical adenopathy.  Skin:    Coloration: Skin is not jaundiced or pale.  Neurological:     Mental Status: She is alert and oriented to person, place, and time.     Comments: No focal deficit except poss decreased sensation to LT   Psychiatric:        Behavior: Behavior normal.     UC Treatments / Results  Labs (all labs ordered are listed, but only abnormal results are displayed) Labs Reviewed - No data to display  EKG   Radiology No results found.  Procedures Procedures (including critical care time)  Medications Ordered in UC Medications - No data to display  Initial Impression / Assessment and Plan / UC Course  I have reviewed the triage vital signs and the nursing notes.  Pertinent labs & imaging results that were available during my care of the patient were reviewed by me and considered in my medical decision making (see chart for details).     We are going to draw some lab to assess her symptoms.  I am going to restart her amlodipine and lisinopril.  She has a new patient appointment with primary care at the end of August.  She will continue to monitor her blood pressure at home Final Clinical Impressions(s) / UC Diagnoses   Final diagnoses:  None   Discharge Instructions   None    ED Prescriptions   None    PDMP not reviewed this encounter.   Zenia Resides, MD 05/18/22 1144

## 2022-05-18 NOTE — ED Triage Notes (Signed)
Pt is present today with c/o bilateral hand, bilateral feet numbness, and facial numbness. Pt sx started x3 weeks ago

## 2022-05-18 NOTE — Discharge Instructions (Addendum)
Take amlodipine 5 mg--1 daily for blood pressure  Take lisinopril 5 mg--1 daily for blood pressure  We have drawn blood to do the following blood tests--CBC(a blood count), CMP (sugar, sodium, potassium, kidney and liver function), TSH (thyroid), and vitamin B12 levels.  Our staff will call you if anything is significantly abnormal

## 2022-05-19 LAB — CBC WITH DIFFERENTIAL/PLATELET
Basophils Absolute: 0 10*3/uL (ref 0.0–0.2)
Basos: 0 %
EOS (ABSOLUTE): 0.1 10*3/uL (ref 0.0–0.4)
Eos: 1 %
Hematocrit: 38.4 % (ref 34.0–46.6)
Hemoglobin: 13 g/dL (ref 11.1–15.9)
Immature Grans (Abs): 0 10*3/uL (ref 0.0–0.1)
Immature Granulocytes: 0 %
Lymphocytes Absolute: 3.1 10*3/uL (ref 0.7–3.1)
Lymphs: 39 %
MCH: 36.1 pg — ABNORMAL HIGH (ref 26.6–33.0)
MCHC: 33.9 g/dL (ref 31.5–35.7)
MCV: 107 fL — ABNORMAL HIGH (ref 79–97)
Monocytes Absolute: 0.6 10*3/uL (ref 0.1–0.9)
Monocytes: 7 %
Neutrophils Absolute: 4.1 10*3/uL (ref 1.4–7.0)
Neutrophils: 53 %
Platelets: 172 10*3/uL (ref 150–450)
RBC: 3.6 x10E6/uL — ABNORMAL LOW (ref 3.77–5.28)
RDW: 12.2 % (ref 11.7–15.4)
WBC: 7.9 10*3/uL (ref 3.4–10.8)

## 2022-05-19 LAB — COMPREHENSIVE METABOLIC PANEL
ALT: 38 IU/L — ABNORMAL HIGH (ref 0–32)
AST: 60 IU/L — ABNORMAL HIGH (ref 0–40)
Albumin/Globulin Ratio: 1.1 — ABNORMAL LOW (ref 1.2–2.2)
Albumin: 3.3 g/dL — ABNORMAL LOW (ref 3.8–4.8)
Alkaline Phosphatase: 125 IU/L — ABNORMAL HIGH (ref 44–121)
BUN/Creatinine Ratio: 6 — ABNORMAL LOW (ref 9–23)
BUN: 4 mg/dL — ABNORMAL LOW (ref 6–24)
Bilirubin Total: 0.3 mg/dL (ref 0.0–1.2)
CO2: 21 mmol/L (ref 20–29)
Calcium: 8.5 mg/dL — ABNORMAL LOW (ref 8.7–10.2)
Chloride: 105 mmol/L (ref 96–106)
Creatinine, Ser: 0.62 mg/dL (ref 0.57–1.00)
Globulin, Total: 3.1 g/dL (ref 1.5–4.5)
Glucose: 87 mg/dL (ref 70–99)
Potassium: 3.2 mmol/L — ABNORMAL LOW (ref 3.5–5.2)
Sodium: 145 mmol/L — ABNORMAL HIGH (ref 134–144)
Total Protein: 6.4 g/dL (ref 6.0–8.5)
eGFR: 113 mL/min/{1.73_m2} (ref >59)

## 2022-05-19 LAB — VITAMIN B12: Vitamin B-12: 1464 pg/mL — ABNORMAL HIGH (ref 232–1245)

## 2022-05-19 LAB — TSH: TSH: 0.742 u[IU]/mL (ref 0.450–4.500)

## 2022-05-22 ENCOUNTER — Encounter (HOSPITAL_COMMUNITY): Payer: Self-pay

## 2022-05-22 ENCOUNTER — Emergency Department (HOSPITAL_COMMUNITY): Payer: 59

## 2022-05-22 ENCOUNTER — Emergency Department (HOSPITAL_COMMUNITY)
Admission: EM | Admit: 2022-05-22 | Discharge: 2022-05-22 | Disposition: A | Discharge status: Home / Self Care | Payer: 59 | Attending: Emergency Medicine | Admitting: Emergency Medicine

## 2022-05-22 DIAGNOSIS — R202 Paresthesia of skin: Secondary | ICD-10-CM | POA: Insufficient documentation

## 2022-05-22 DIAGNOSIS — E876 Hypokalemia: Secondary | ICD-10-CM | POA: Insufficient documentation

## 2022-05-22 DIAGNOSIS — R0789 Other chest pain: Secondary | ICD-10-CM | POA: Insufficient documentation

## 2022-05-22 DIAGNOSIS — R0602 Shortness of breath: Secondary | ICD-10-CM | POA: Diagnosis not present

## 2022-05-22 DIAGNOSIS — R079 Chest pain, unspecified: Secondary | ICD-10-CM | POA: Diagnosis present

## 2022-05-22 LAB — CBC
HCT: 35.6 % — ABNORMAL LOW (ref 36.0–46.0)
Hemoglobin: 12.2 g/dL (ref 12.0–15.0)
MCH: 37.2 pg — ABNORMAL HIGH (ref 26.0–34.0)
MCHC: 34.3 g/dL (ref 30.0–36.0)
MCV: 108.5 fL — ABNORMAL HIGH (ref 80.0–100.0)
Platelets: 160 10*3/uL (ref 150–400)
RBC: 3.28 MIL/uL — ABNORMAL LOW (ref 3.87–5.11)
RDW: 13.3 % (ref 11.5–15.5)
WBC: 7.7 10*3/uL (ref 4.0–10.5)
nRBC: 0 % (ref 0.0–0.2)

## 2022-05-22 LAB — I-STAT BETA HCG BLOOD, ED (MC, WL, AP ONLY): I-stat hCG, quantitative: <5 m[IU]/mL (ref <5)

## 2022-05-22 LAB — BASIC METABOLIC PANEL
Anion gap: 9 (ref 5–15)
BUN: <5 mg/dL — ABNORMAL LOW (ref 6–20)
CO2: 24 mmol/L (ref 22–32)
Calcium: 8.5 mg/dL — ABNORMAL LOW (ref 8.9–10.3)
Chloride: 108 mmol/L (ref 98–111)
Creatinine, Ser: 0.57 mg/dL (ref 0.44–1.00)
GFR, Estimated: >60 mL/min (ref >60)
Glucose, Bld: 94 mg/dL (ref 70–99)
Potassium: 2.7 mmol/L — CL (ref 3.5–5.1)
Sodium: 141 mmol/L (ref 135–145)

## 2022-05-22 LAB — TROPONIN I (HIGH SENSITIVITY): Troponin I (High Sensitivity): <2 ng/L (ref <18)

## 2022-05-22 LAB — D-DIMER, QUANTITATIVE: D-Dimer, Quant: 0.41 ug/mL-FEU (ref 0.00–0.50)

## 2022-05-22 MED ORDER — POTASSIUM CHLORIDE CRYS ER 20 MEQ PO TBCR
40.0000 meq | EXTENDED_RELEASE_TABLET | Freq: Once | ORAL | Status: AC
  Administered 2022-05-22: 40 meq via ORAL
  Filled 2022-05-22: qty 2

## 2022-05-22 MED ORDER — POTASSIUM CHLORIDE 10 MEQ/100ML IV SOLN
10.0000 meq | Freq: Once | INTRAVENOUS | Status: AC
  Administered 2022-05-22: 10 meq via INTRAVENOUS
  Filled 2022-05-22: qty 100

## 2022-05-22 MED ORDER — KETOROLAC TROMETHAMINE 15 MG/ML IJ SOLN
15.0000 mg | Freq: Once | INTRAMUSCULAR | Status: AC
  Administered 2022-05-22: 15 mg via INTRAVENOUS
  Filled 2022-05-22: qty 1

## 2022-05-22 NOTE — ED Provider Triage Note (Signed)
Emergency Medicine Provider Triage Evaluation Note  Sonya Hurst , a 41 y.o. female  was evaluated in triage.  Pt complains of SOB and chest pain. The patient reports she woke up with morning at 0730 with chest pain and SOB. She reports that she went to work and still wasn't feeling well. She went to UC who sent her over here. Denies any nausea or diaphoretic.   Review of Systems  Positive:  Negative:   Physical Exam  BP 139/90 (BP Location: Left Arm)   Pulse (!) 124   Temp 98.4 F (36.9 C) (Oral)   Resp 14   SpO2 100%  Gen:   Awake, no distress   Resp:  Tachypnea, CTAB, speaking in full sentences with ease.  MSK:   Moves extremities without difficulty  Other:  Tachycardic  Medical Decision Making  Medically screening exam initiated at 12:45 PM.  Appropriate orders placed.  Sonya Hurst was informed that the remainder of the evaluation will be completed by another provider, this initial triage assessment does not replace that evaluation, and the importance of remaining in the ED until their evaluation is complete.  Patient is tachypnic and tachycardic in triage with SOB and chest pain. Denies any h/o DVT or PE. Will order cardiac labs along with d-dimer as the patient can not PERC out.    Achille Rich, PA-C 05/22/22 1248

## 2022-05-22 NOTE — ED Provider Notes (Signed)
[facility]-EMERGENCY DEPT Provider Note   CSN: [id number] Arrival date & time: 05/22/22  1230     History  Chief Complaint  Patient presents with   Chest Pain   Shortness of Breath    Sonya Hurst is a 41 y.o. female.  41 year old female with prior medical history as detailed below presents for evaluation.  Patient complains of anterior chest wall discomfort.  This pain began this morning around 7 AM.  Patient reports that the pain is associated with some mild shortness of breath.  She presented to urgent care and was sent to the ED for evaluation.  She also complains of tingling to the fingertips and around the lips.  This began this afternoon after she began to hyperventilate secondary to her discomfort.  She denies prior history of cardiac disease.  She denies lower extremity pain or swelling.  The history is provided by the patient and medical records.  Chest Pain Pain location:  L lateral chest Pain quality: aching   Pain radiates to:  Does not radiate Pain severity:  Mild Onset quality:  Sudden Duration:  10 hours Timing:  Rare Progression:  Improving Chronicity:  New Associated symptoms: shortness of breath   Shortness of Breath Associated symptoms: chest pain       Home Medications Prior to Admission medications   Medication Sig Start Date End Date Taking? Authorizing Provider  amLODipine (NORVASC) 5 MG tablet Take 1 tablet (5 mg total) by mouth daily. 05/18/22   Zenia ResidesBanister, Pamela K, MD  calcium carbonate (TUMS - DOSED IN MG ELEMENTAL CALCIUM) 500 MG chewable tablet Chew 2 tablets by mouth daily as needed for indigestion or heartburn.    [provider]  lisinopril (ZESTRIL) 5 MG tablet Take 1 tablet (5 mg total) by mouth daily. 05/18/22   Zenia ResidesBanister, Pamela K, MD  phentermine (ADIPEX-P) 37.5 MG tablet Take 37.5 mg by mouth every morning. 01/10/22   [provider]  propranolol ER (INDERAL LA) 60 MG 24 hr capsule Take 60 mg by  mouth daily. 03/23/22   [provider]      Allergies    Pataday [olopatadine hcl]    Review of Systems   Review of Systems  Respiratory:  Positive for shortness of breath.   Cardiovascular:  Positive for chest pain.  All other systems reviewed and are negative.  Physical Exam Updated Vital Signs BP 110/62   Pulse 99   Temp 98.4 F (36.9 C) (Oral)   Resp (!) 32   SpO2 100%  Physical Exam Vitals and nursing note reviewed.  Constitutional:      General: She is not in acute distress.    Appearance: Normal appearance. She is well-developed.  HENT:     Head: Normocephalic and atraumatic.  Eyes:     Conjunctiva/sclera: Conjunctivae normal.     Pupils: Pupils are equal, round, and reactive to light.  Cardiovascular:     Rate and Rhythm: Normal rate and regular rhythm.     Heart sounds: Normal heart sounds.  Pulmonary:     Effort: Pulmonary effort is normal. No respiratory distress.     Breath sounds: Normal breath sounds.  Chest:     Chest wall: Tenderness present.     Comments: Palpation of the anterior chest wall elicits tenderness.  This reproduces patient's pain completely. Abdominal:     General: There is no distension.     Palpations: Abdomen is soft.     Tenderness: There is no abdominal  tenderness.  Musculoskeletal:        General: No deformity. Normal range of motion.     Cervical back: Normal range of motion and neck supple.  Skin:    General: Skin is warm and dry.  Neurological:     General: No focal deficit present.     Mental Status: She is alert and oriented to person, place, and time.    ED Results / Procedures / Treatments   Labs (all labs ordered are listed, but only abnormal results are displayed) Labs Reviewed  BASIC METABOLIC PANEL - Abnormal; Notable for the following components:      Result Value   Potassium 2.7 (*)    BUN <5 (*)    Calcium 8.5 (*)    All other components within normal limits  CBC - Abnormal; Notable for the  following components:   RBC 3.28 (*)    HCT 35.6 (*)    MCV 108.5 (*)    MCH 37.2 (*)    All other components within normal limits  D-DIMER, QUANTITATIVE  I-STAT BETA HCG BLOOD, ED ([facility], [facility], [facility] ONLY)  TROPONIN I (HIGH SENSITIVITY)    EKG EKG Interpretation  Date/Time:  Monday May 22 2022 12:38:27 EDT Ventricular Rate:  124 PR Interval:  124 QRS Duration: 98 QT Interval:  331 QTC Calculation: 476 R Axis:   30 Text Interpretation: Sinus tachycardia Low voltage, precordial leads Borderline T abnormalities, diffuse leads Confirmed by Kristine Royal [phone]) on 05/22/2022 12:47:30 PM  Radiology DG Chest 2 View  Result Date: 05/22/2022 CLINICAL DATA:  Chest pain EXAM: CHEST - 2 VIEW COMPARISON:  2016 FINDINGS: The heart size and mediastinal contours are within normal limits. Both lungs are clear. No pleural effusion or pneumothorax. The visualized skeletal structures are unremarkable. IMPRESSION: No active cardiopulmonary disease. Electronically Signed   By: Guadlupe Spanish M.D.   On: 05/22/2022 13:43    Procedures Procedures    Medications Ordered in ED Medications  potassium chloride SA (KLOR-CON M) CR tablet 40 mEq (40 mEq Oral Given 05/22/22 1521)  potassium chloride 10 mEq in 100 mL IVPB (10 mEq Intravenous New Bag/Given 05/22/22 1527)  ketorolac (TORADOL) 15 MG/ML injection 15 mg (15 mg Intravenous Given 05/22/22 1536)    ED Course/ Medical Decision Making/ A&P                           Medical Decision Making Amount and/or Complexity of Data Reviewed Labs: ordered. Radiology: ordered.  Risk Prescription drug management.    Medical Screen Complete  This patient presented to the ED with complaint of chest pain.  This complaint involves an extensive number of treatment options. The initial differential diagnosis includes, but is not limited to, chest wall strain, ACS, PE, other intra thoracic pathology, etc.  This presentation is: Acute, Self-Limited, Previously  Undiagnosed, Uncertain Prognosis, Complicated, Systemic Symptoms, and Threat to Life/Bodily Function  Patient is presenting with complaint of anterior chest wall pain.  Patient described pain is highly atypical  Patient's pain is reproduced with palpation of anterior chest wall  EKG obtained is without evidence of acute ischemia.  Troponin is undetectable.  D-dimer is negative.  Screening labs are without significant abnormality other than hypokalemia.  Patient was visibly hyperventilating at time of blood draw.  Potassium replaced.  Patient feels significant improved after ED evaluation.  She does understand need for close outpatient follow-up.  Strict return precautions given and understood.  Additional history obtained:  External records from outside sources obtained and reviewed including prior ED visits and prior Inpatient records.    Lab Tests:  I ordered and personally interpreted labs.  The pertinent results include: C, BMP, troponin, D-dimer, hcg   Imaging Studies ordered:  I ordered imaging studies including chest x-ray I independently visualized and interpreted obtained imaging which showed NAD I agree with the radiologist interpretation.   Cardiac Monitoring:  The patient was maintained on a cardiac monitor.  I personally viewed and interpreted the cardiac monitor which showed an underlying rhythm of: NSR   Medicines ordered:  I ordered medication including potassium for potassium replacement Reevaluation of the patient after these medicines showed that the patient: improved  Problem List / ED Course:  Atypical chest pain, hypokalemia   Reevaluation:  After the interventions noted above, I reevaluated the patient and found that they have: improved  Disposition:  After consideration of the diagnostic results and the patients response to treatment, I feel that the patent would benefit from close outpatient follow-up.          Final Clinical  Impression(s) / ED Diagnoses Final diagnoses:  Atypical chest pain  Hypokalemia    Rx / DC Orders ED Discharge Orders     None         Wynetta Fines, MD 05/22/22 1652

## 2022-05-22 NOTE — Discharge Instructions (Addendum)
Return for any problem.  ?

## 2022-05-22 NOTE — ED Triage Notes (Addendum)
Pt sent over by urgent care. Worried for blood clot. Pt reports 7/10 chest pain that woke her up. Reports it feels like something is sitting on her chest  C/o shob    A/Ox4 Ambulatory in triage

## 2022-07-05 ENCOUNTER — Encounter: Payer: Self-pay | Admitting: Neurology

## 2022-07-18 ENCOUNTER — Emergency Department (HOSPITAL_COMMUNITY)
Admission: EM | Admit: 2022-07-18 | Discharge: 2022-07-18 | Discharge status: Against Medical Advice | Payer: Commercial Managed Care - HMO | Attending: Emergency Medicine | Admitting: Emergency Medicine

## 2022-07-18 ENCOUNTER — Encounter (HOSPITAL_COMMUNITY): Payer: Self-pay

## 2022-07-18 DIAGNOSIS — Z5321 Procedure and treatment not carried out due to patient leaving prior to being seen by health care provider: Secondary | ICD-10-CM | POA: Diagnosis not present

## 2022-07-18 DIAGNOSIS — W19XXXA Unspecified fall, initial encounter: Secondary | ICD-10-CM | POA: Insufficient documentation

## 2022-07-18 DIAGNOSIS — R200 Anesthesia of skin: Secondary | ICD-10-CM | POA: Diagnosis present

## 2022-07-18 NOTE — ED Triage Notes (Signed)
Pt reports bilateral numbness and multiple falls that began June 6th.   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   7/10 bilateral feet pain.   Pt reports last fall was Last night and has fallen 3x this past weekend.  Reports hitting her head Saturday night.    A/Ox4 Ambulatory in triage.

## 2022-07-18 NOTE — ED Provider Triage Note (Signed)
Emergency Medicine Provider Triage Evaluation Note  Sonya Hurst , a 41 y.o. female  was evaluated in triage.  Pt complains of neuropathy, and recurrent falls she states she was diagnosed with neuropathy beginning of June and this has been an ongoing issue since then.  She has been working with her primary care provider who referred her to physical therapy.  She has physical therapy sessions weekly.  She states Saturday she lost her balance and hit the back of her head on a brick wall.  Denies loss of consciousness.  Denies nausea or vomiting since then.  States that she called her PCP office who asked her to come into the emergency room.  Per patient her main concern seems to be finding out the etiology reasoning behind her neuropathy.  She states she has been told her neuropathy is secondary to history of alcohol abuse.  Review of Systems  Positive: As above Negative: As above  Physical Exam  BP (!) 158/111 (BP Location: Left Wrist)   Pulse (!) 114   Temp 98.7 F (37.1 C) (Oral)   Resp 18   SpO2 100%  Gen:   Awake, no distress   Resp:  Normal effort  MSK:   Moves extremities without difficulty Other:    Medical Decision Making  Medically screening exam initiated at 12:54 PM.  Appropriate orders placed.  Sonya Hurst was informed that the remainder of the evaluation will be completed by another provider, this initial triage assessment does not replace that evaluation, and the importance of remaining in the ED until their evaluation is complete.     Marita Kansas, PA-C 07/18/22 1256

## 2022-07-28 ENCOUNTER — Other Ambulatory Visit: Payer: Self-pay

## 2022-07-28 ENCOUNTER — Emergency Department (HOSPITAL_BASED_OUTPATIENT_CLINIC_OR_DEPARTMENT_OTHER)
Admit: 2022-07-28 | Discharge: 2022-07-28 | Disposition: A | Discharge status: Home / Self Care | Payer: Commercial Managed Care - HMO | Attending: Emergency Medicine | Admitting: Emergency Medicine

## 2022-07-28 ENCOUNTER — Encounter (HOSPITAL_COMMUNITY): Payer: Self-pay

## 2022-07-28 ENCOUNTER — Emergency Department (HOSPITAL_COMMUNITY)
Admission: EM | Admit: 2022-07-28 | Discharge: 2022-07-28 | Disposition: A | Discharge status: Home / Self Care | Payer: Commercial Managed Care - HMO | Attending: Emergency Medicine | Admitting: Emergency Medicine

## 2022-07-28 DIAGNOSIS — I82412 Acute embolism and thrombosis of left femoral vein: Secondary | ICD-10-CM | POA: Insufficient documentation

## 2022-07-28 DIAGNOSIS — M79662 Pain in left lower leg: Secondary | ICD-10-CM | POA: Diagnosis present

## 2022-07-28 DIAGNOSIS — M79605 Pain in left leg: Secondary | ICD-10-CM

## 2022-07-28 HISTORY — DX: Polyneuropathy, unspecified: G62.9

## 2022-07-28 LAB — BRAIN NATRIURETIC PEPTIDE: B Natriuretic Peptide: 20 pg/mL (ref 0.0–100.0)

## 2022-07-28 LAB — CBC
HCT: 35.4 % — ABNORMAL LOW (ref 36.0–46.0)
Hemoglobin: 12.1 g/dL (ref 12.0–15.0)
MCH: 38.1 pg — ABNORMAL HIGH (ref 26.0–34.0)
MCHC: 34.2 g/dL (ref 30.0–36.0)
MCV: 111.3 fL — ABNORMAL HIGH (ref 80.0–100.0)
Platelets: 141 10*3/uL — ABNORMAL LOW (ref 150–400)
RBC: 3.18 MIL/uL — ABNORMAL LOW (ref 3.87–5.11)
RDW: 17.5 % — ABNORMAL HIGH (ref 11.5–15.5)
WBC: 8.9 10*3/uL (ref 4.0–10.5)
nRBC: 0 % (ref 0.0–0.2)

## 2022-07-28 LAB — BASIC METABOLIC PANEL
Anion gap: 9 (ref 5–15)
BUN: 9 mg/dL (ref 6–20)
CO2: 23 mmol/L (ref 22–32)
Calcium: 8.7 mg/dL — ABNORMAL LOW (ref 8.9–10.3)
Chloride: 107 mmol/L (ref 98–111)
Creatinine, Ser: 0.69 mg/dL (ref 0.44–1.00)
GFR, Estimated: >60 mL/min (ref >60)
Glucose, Bld: 111 mg/dL — ABNORMAL HIGH (ref 70–99)
Potassium: 4.1 mmol/L (ref 3.5–5.1)
Sodium: 139 mmol/L (ref 135–145)

## 2022-07-28 LAB — TROPONIN I (HIGH SENSITIVITY): Troponin I (High Sensitivity): 2 ng/L (ref <18)

## 2022-07-28 MED ORDER — APIXABAN (ELIQUIS) VTE STARTER PACK (10MG AND 5MG)
ORAL_TABLET | ORAL | 0 refills | Status: DC
Start: 2022-07-28 — End: 2024-05-10

## 2022-07-28 NOTE — Progress Notes (Signed)
Left lower extremity venous duplex has been completed. Preliminary results can be found in CV Proc through chart review.  Results were given to Lorin Roemhildt PA.  07/28/22 3:20 PM Olen Cordial RVT

## 2022-07-28 NOTE — Discharge Instructions (Addendum)
You were seen in the emergency department for leg pain.  As we discussed, the ultrasound showed you have a blood clot in your legs. We did additional testing that showed there's a lower risk of there also being a blood clot in your lungs.   We are starting you on a blood thinner, please follow the package instructions for how to take. It is incredibly important you follow up with your primary doctor to discuss long term management of your medications. You will need to be on the blood thinner for at least 3 months.   Continue to monitor how you're doing and return to the ER for new or worsening symptoms such as chest pain or difficulty breathing.

## 2022-07-28 NOTE — ED Triage Notes (Signed)
Patient was at physical therapy today. Noted that patient had left leg swelling and pain x 1 week and worsening.  Patient was sent to the ED to r/o a DVT.

## 2022-07-28 NOTE — ED Provider Notes (Signed)
[facility]-EMERGENCY DEPT Provider Note   CSN: [id number] Arrival date & time: 07/28/22  1348     History  Chief Complaint  Patient presents with   Leg Swelling    Sonya Hurst is a 41 y.o. female who presents the emergency department complaining of left leg pain and swelling for the past week.  Her boss initially noted that her gait was abnormal, and recommended she go to physical therapy.  She was at physical therapy today when they noticed that her left calf was swollen.  They measured a 5 cm difference, and recommended she go to the ER for evaluation of a DVT.  Patient denies any history of blood clots.  Does not take OCPs, no recent long travel.  Later during evaluation patient mentioned that she felt short of breath upon coming to the ER.   HPI     Home Medications Prior to Admission medications   Medication Sig Start Date End Date Taking? Authorizing Provider  APIXABAN (ELIQUIS) VTE STARTER PACK (10MG  AND 5MG ) Take as directed on package: start with two-5mg  tablets twice daily for 7 days. On day 8, switch to one-5mg  tablet twice daily. 07/28/22  Yes Camillia Marcy T, PA-C  amLODipine (NORVASC) 5 MG tablet Take 1 tablet (5 mg total) by mouth daily. 05/18/22   09/27/22, MD  calcium carbonate (TUMS - DOSED IN MG ELEMENTAL CALCIUM) 500 MG chewable tablet Chew 2 tablets by mouth daily as needed for indigestion or heartburn.    [provider]  lisinopril (ZESTRIL) 5 MG tablet Take 1 tablet (5 mg total) by mouth daily. 05/18/22   Zenia Resides, MD  phentermine (ADIPEX-P) 37.5 MG tablet Take 37.5 mg by mouth every morning. 01/10/22   [provider]  propranolol ER (INDERAL LA) 60 MG 24 hr capsule Take 60 mg by mouth daily. 03/23/22   [provider]      Allergies    Pataday [olopatadine hcl]    Review of Systems   Review of Systems  Constitutional:  Negative for fever.  Respiratory:  Positive for shortness of  breath. Negative for cough.   Cardiovascular:  Positive for leg swelling. Negative for chest pain.  All other systems reviewed and are negative.   Physical Exam Updated Vital Signs BP (!) 139/97 (BP Location: Right Arm)   Pulse (!) 120   Temp 99.1 F (37.3 C) (Oral)   Resp 18   Ht 5\' 4"  (1.626 m)   Wt 135.2 kg   SpO2 100%   BMI 51.15 kg/m  Physical Exam Vitals and nursing note reviewed.  Constitutional:      Appearance: Normal appearance.  HENT:     Head: Normocephalic and atraumatic.  Eyes:     Conjunctiva/sclera: Conjunctivae normal.  Cardiovascular:     Rate and Rhythm: Normal rate and regular rhythm.  Pulmonary:     Effort: Pulmonary effort is normal. No respiratory distress.     Breath sounds: Normal breath sounds.  Abdominal:     General: There is no distension.     Palpations: Abdomen is soft.     Tenderness: There is no abdominal tenderness.  Musculoskeletal:     Comments: Left calf swollen compared to right, nonpitting. Difficult to palpate pedal pulses due to swelling.   Skin:    General: Skin is warm and dry.  Neurological:     General: No focal deficit present.     Mental Status: She is alert.  ED Results / Procedures / Treatments   Labs (all labs ordered are listed, but only abnormal results are displayed) Labs Reviewed  CBC - Abnormal; Notable for the following components:      Result Value   RBC 3.18 (*)    HCT 35.4 (*)    MCV 111.3 (*)    MCH 38.1 (*)    RDW 17.5 (*)    Platelets 141 (*)    All other components within normal limits  BASIC METABOLIC PANEL - Abnormal; Notable for the following components:   Glucose, Bld 111 (*)    Calcium 8.7 (*)    All other components within normal limits  BRAIN NATRIURETIC PEPTIDE  TROPONIN I (HIGH SENSITIVITY)  TROPONIN I (HIGH SENSITIVITY)    EKG None  Radiology VAS [place] LOWER EXTREMITY VENOUS (DVT) (7a-7p)  Result Date: 07/28/2022  Lower Venous DVT Study Patient Name:  S[facility]a [facility]   Date of Exam:   07/28/2022 Medical Rec #: [id number]          Accession #:    [id number] Date of Birth: [birth year]           Patient Gender: F Patient Age:   50 years Exam Location:  [facility] Procedure:      VAS [place] LOWER EXTREMITY VENOUS (DVT) Referring Phys: Griffin Basil Dezmon Conover --------------------------------------------------------------------------------  Indications: Pain.  Risk Factors: None identified. Limitations: Body habitus and poor ultrasound/tissue interface. Comparison Study: No prior studies. Performing Technologist: Chanda Busing RVT  Examination Guidelines: A complete evaluation includes B-mode imaging, spectral Doppler, color Doppler, and power Doppler as needed of all accessible portions of each vessel. Bilateral testing is considered an integral part of a complete examination. Limited examinations for reoccurring indications may be performed as noted. The reflux portion of the exam is performed with the patient in reverse Trendelenburg.  +-----+---------------+---------+-----------+----------+--------------+ RIGHTCompressibilityPhasicitySpontaneityPropertiesThrombus Aging +-----+---------------+---------+-----------+----------+--------------+ CFV  Full           Yes      Yes                                 +-----+---------------+---------+-----------+----------+--------------+   +---------+---------------+---------+-----------+----------+-------------------+ LEFT     CompressibilityPhasicitySpontaneityPropertiesThrombus Aging      +---------+---------------+---------+-----------+----------+-------------------+ CFV      Full           Yes      Yes                                      +---------+---------------+---------+-----------+----------+-------------------+ SFJ      Full                                                             +---------+---------------+---------+-----------+----------+-------------------+ FV Prox  Full                                                              +---------+---------------+---------+-----------+----------+-------------------+ FV Mid   Full                                                             +---------+---------------+---------+-----------+----------+-------------------+  FV DistalNone           No       No                   Acute               +---------+---------------+---------+-----------+----------+-------------------+ PFV      Full                                                             +---------+---------------+---------+-----------+----------+-------------------+ POP      None           No       No                   Acute               +---------+---------------+---------+-----------+----------+-------------------+ PTV      Full                                                             +---------+---------------+---------+-----------+----------+-------------------+ PERO                                                  Not well visualized +---------+---------------+---------+-----------+----------+-------------------+ Gastroc  Full                                                             +---------+---------------+---------+-----------+----------+-------------------+    Summary: RIGHT: - No evidence of common femoral vein obstruction.  LEFT: - Findings consistent with acute deep vein thrombosis involving the left femoral vein, and left popliteal vein. - No cystic structure found in the popliteal fossa.  *See table(s) above for measurements and observations.    Preliminary     Procedures Procedures    Medications Ordered in ED Medications - No data to display  ED Course/ Medical Decision Making/ A&P Clinical Course as of 07/28/22 1714  Fri Jul 28, 2022  1512 41 YOF with 5cm right LE swelling with TTP x1 week. Now with chest pain or SOB. HR tachycardic.  Notably, patient had very similar visit 2 months ago.  Heart rate 120s, D-dimer  negative.  Today is complicated by right lower extremity swelling notable for a DVT.  We will plan for screening labs include CBC, CMP, troponin and BNP to evaluate for left or right heart strain respectively.  If troponin and BNP are reassuring, patient can be discharged on Eliquis. [CC]    Clinical Course User Index [CC] Glyn Ade, MD                           Medical Decision Making  This patient is a 41 y.o. female  who presents to the ED for concern  of atraumatic left calf pain and swelling x 1 week.   Differential diagnoses prior to evaluation: The emergent differential diagnosis includes, but is not limited to,  fluid retention, peripheral vascular disease, cellulitis, DVT, PE.  This is not an exhaustive differential.  Past Medical History / Co-morbidities: Migraines, neuropathy  Additional history: Chart reviewed. Pertinent results include: Baseline heart rate appears in 120's. Previous negative d-dimer levels.   Physical Exam: Physical exam performed. The pertinent findings include: Tachycardic to 120. Slightly hypertensive. Otherwise normal vitals. Left calf larger in size compared to right. Normal sensation.   Lab Tests/Imaging studies: I personally interpreted labs/imaging and the pertinent results include:  No leukocytosis, normal hemoglobin. BMP unremarkable. BNP 20. Troponin 2.   DVT ultrasound of left lower leg positive for DVT in left femoral and left popliteal veins. I agree with the radiologist interpretation.  Cardiac monitoring: EKG obtained and interpreted by my attending physician which shows: sinus tachycardia   Disposition: After consideration of the diagnostic results and the patients response to treatment, I feel that emergency department workup does not suggest an emergent condition requiring admission or immediate intervention beyond what has been performed at this time. Low suspicion for PE as patient endorses baseline tachycardic unchanged today  (confirmed with previous records), negative troponin and BNP with no evidence of heart strain. With no increased work of breathing and normal oxygen saturations.   The plan is: discharge to home with prescription for eliquis and close PCP follow up. The patient is safe for discharge and has been instructed to return immediately for worsening symptoms, change in symptoms or any other concerns.   I discussed this case with my attending physician Dr. Doran Durand who cosigned this note including patient's presenting symptoms, physical exam, and planned diagnostics and interventions. Attending physician stated agreement with plan or made changes to plan which were implemented.   Final Clinical Impression(s) / ED Diagnoses Final diagnoses:  Acute deep vein thrombosis (DVT) of femoral vein of left lower extremity (HCC)  Pain of left calf    Rx / DC Orders ED Discharge Orders          Ordered    APIXABAN (ELIQUIS) VTE STARTER PACK (10MG  AND 5MG )        07/28/22 1712           Portions of this report may have been transcribed using voice recognition software. Every effort was made to ensure accuracy; however, inadvertent computerized transcription errors may be present.    07/28/22 1715    Jeanella Flattery, MD 07/28/22 1719

## 2022-08-07 ENCOUNTER — Telehealth: Payer: Self-pay | Admitting: Physician Assistant

## 2022-08-07 NOTE — Telephone Encounter (Signed)
Scheduled appt per 8/21 referral. Pt is aware of appt date and time. Pt is aware to arrive 15 mins prior to appt time and to bring and updated insurance card. Pt is aware of appt location.   

## 2022-08-14 ENCOUNTER — Other Ambulatory Visit: Payer: Self-pay | Admitting: Internal Medicine

## 2022-08-14 DIAGNOSIS — R269 Unspecified abnormalities of gait and mobility: Secondary | ICD-10-CM

## 2022-08-14 DIAGNOSIS — R296 Repeated falls: Secondary | ICD-10-CM

## 2022-08-17 ENCOUNTER — Ambulatory Visit: Payer: Medicaid Other | Admitting: Family Medicine

## 2022-08-20 ENCOUNTER — Ambulatory Visit
Admission: RE | Admit: 2022-08-20 | Discharge: 2022-08-20 | Disposition: A | Discharge status: Home / Self Care | Payer: Commercial Managed Care - HMO | Source: Ambulatory Visit | Attending: Internal Medicine | Admitting: Internal Medicine

## 2022-08-20 DIAGNOSIS — R296 Repeated falls: Secondary | ICD-10-CM

## 2022-08-20 DIAGNOSIS — R269 Unspecified abnormalities of gait and mobility: Secondary | ICD-10-CM

## 2022-08-30 ENCOUNTER — Inpatient Hospital Stay: Payer: PRIVATE HEALTH INSURANCE | Attending: Physician Assistant | Admitting: Physician Assistant

## 2022-08-30 ENCOUNTER — Inpatient Hospital Stay: Payer: PRIVATE HEALTH INSURANCE

## 2022-09-12 NOTE — Progress Notes (Unsigned)
Initial neurology clinic note  SERVICE DATE: 09/14/22  Reason for Evaluation: Consultation requested by Hyacinth Meeker, [place], [place] for an opinion regarding neuropathy, gait disorder. My final recommendations will be communicated back to the requesting physician by way of shared medical record or letter to requesting physician via [place] mail.  HPI: This is Ms. Sonya Hurst, a 41 y.o. right-handed female with a medical history of HTN, current smoker, obesity, DVT (on eliquis), and EtOH abuse who presents to neurology clinic with the chief complaint of numbness and tingling. The patient is alone today.  Symptoms have been present since 05/23/22. She woke up and her legs and hands felt tight with sharp pains in her feet. She numbness was in her legs below the knee and in her hands. She was having trouble walking and was falling. When she stands, she has to make sure she knows where they are. She has had about 6-7 falls since 05/23/22. She does not use an assistive device to help walk.  She endorses being off balance particularly when she closes her eyes or has her eyes covered (taking a shower or putting on a shirt).  She denies any symptoms prior to 05/23/22. Patient was in the hospital the day before. She had a DVT at that visit. She has been seen at the ED and been told she has neuropathy.  Patient is currently on Gabapentin 600 mg TID. She feels like taking the gabapentin may intensify her symptoms, then her symptoms improve after 40 minutes. She briefly switched gabapentin to Lyrica, but this made her feet swell, so she was put back on gabapentin. She feels like the numbness and tingling are about the same, but her walking has improved since 05/23/22. She went to physical therapy for the gait imbalance. She stopped about 1 month ago. She did feel like that helped.  Patient has seen rheumatology, Dr. Drenda Freeze. Per patient, she was positive for Sjogren's. I do not have this result.  The patient has not  had similar episodes of symptoms in the past. She has not had previous episodes of losing vision in one eye. She did have an episode of the left side of her body acutely becoming weak in 2017. It  took about 2 weeks for function to return. She has no residual deficits.  EtOH use: Currently drinks 1.5 bottles of wine per day. 1-2 handles of liquor per day was prior to June Restrictive diet? No Family history of neuropathy/myopathy/NM disease? No  Of note, patient saw Dr. Terrace Arabia at [facility] neurology in 2017 after her left sided weakness episode. Per patient, she had NCS at that time but was told that was normal (I do not see this in the chart). She was told her left sided symptoms were related to migraine per patient.   MEDICATIONS:  Outpatient Encounter Medications as of 09/14/2022  Medication Sig   APIXABAN (ELIQUIS) VTE STARTER PACK (10MG  AND 5MG ) Take as directed on package: start with two-5mg  tablets twice daily for 7 days. On day 8, switch to one-5mg  tablet twice daily. (Patient taking differently: one-5mg  tablet twice daily.)   calcium carbonate (TUMS - DOSED IN MG ELEMENTAL CALCIUM) 500 MG chewable tablet Chew 2 tablets by mouth daily as needed for indigestion or heartburn.   gabapentin (NEURONTIN) 600 MG tablet Take 600 mg by mouth 3 (three) times daily.   methocarbamol (ROBAXIN) 750 MG tablet Take 750 mg by mouth every 8 (eight) hours as needed.   amLODipine (NORVASC) 5 MG tablet  Take 1 tablet (5 mg total) by mouth daily. (Patient not taking: Reported on 09/14/2022)   lisinopril (ZESTRIL) 5 MG tablet Take 1 tablet (5 mg total) by mouth daily. (Patient not taking: Reported on 09/14/2022)   phentermine (ADIPEX-P) 37.5 MG tablet Take 37.5 mg by mouth every morning. (Patient not taking: Reported on 09/14/2022)   propranolol ER (INDERAL LA) 60 MG 24 hr capsule Take 60 mg by mouth daily. (Patient not taking: Reported on 09/14/2022)   No facility-administered encounter medications on file as of  09/14/2022.    PAST MEDICAL HISTORY: Past Medical History:  Diagnosis Date   Hypertension    Migraines    Neuropathy    Obesity     PAST SURGICAL HISTORY: Past Surgical History:  Procedure Laterality Date   ESSURE TUBAL LIGATION  2008   FINGER FRACTURE SURGERY      ALLERGIES: Allergies  Allergen Reactions   Pataday [Olopatadine Hcl] Swelling    FAMILY HISTORY: History reviewed. No pertinent family history.  SOCIAL HISTORY: Social History   Tobacco Use   Smoking status: Every Day    Packs/day: 0.25    Years: 18.00    Total pack years: 4.50    Types: Cigarettes   Smokeless tobacco: Current   Tobacco comments:    Smoke about 5 per day.  Vaping Use   Vaping Use: Never used  Substance Use Topics   Alcohol use: Yes    Alcohol/week: 0.0 standard drinks of alcohol   Drug use: No   Social History   Social History Narrative   Denies caffeine use.     OBJECTIVE: PHYSICAL EXAM: BP (!) 146/97   Pulse (!) 102   Ht 5\' 4"  (1.626 m)   Wt (!) 343 lb (155.6 kg)   SpO2 100%   BMI 58.88 kg/m   General: General appearance: Awake and alert. No distress. Cooperative with exam.  Skin: No obvious rash or jaundice. HEENT: Atraumatic. Anicteric. Lungs: Non-labored breathing on room air  Psych: Affect appropriate.  Neurological: Mental Status: Alert. Speech fluent. No pseudobulbar affect Cranial Nerves: CNII: No RAPD. Visual fields grossly intact. CNIII, IV, VI: PERRL. No nystagmus. EOMI. CN V: Facial sensation intact bilaterally to fine touch. CN VII: Facial muscles symmetric and strong. No ptosis at rest. CN VIII: Hearing grossly intact bilaterally. CN IX: No hypophonia. CN X: Palate elevates symmetrically. CN XI: Full strength shoulder shrug bilaterally. CN XII: Tongue protrusion full and midline. No atrophy or fasciculations. No significant dysarthria Motor: Tone is normal. No fasciculations in extremities. No atrophy. Strength 5/5 in bilateral upper and  lower extremities  Reflexes:  Right Left   Bicep 2+ 2+   Tricep 2+ 2+   BrRad 2+ 2+   Knee 1+ 1+   Ankle 0 0    Pathological Reflexes: Babinski: mute response bilaterally Hoffman: absent bilaterally Troemner: absent bilaterally  Sensation: Pinprick: Intact except: Absent in right hand, absent in left forearm but present in left hand; absent in bilateral lower extremities to above the knee Vibration: Absent in bilateral upper extremities to the elbow and absent bilaterally in lower extremities to the knees Proprioception: Absent in right great toe, present in left great toe Coordination: Intact finger-to- nose-finger bilaterally. Romberg with moderate sway. Gait: Wide-based gait. Unable to perform stress gait testing due to instability.  Lab and Test Review: Internal labs: Normal or unremarkable: TSH BMP significant for mildly elevated glucose LFTs mildly elevated (AST > ALT) CBC significant for MCV of 111.3; platelets 141 B12 (05/18/22):  1464  MRI lumbar spine wo contrast (08/20/22): FINDINGS: Segmentation: Standard. Lowest well-formed disc space labeled the L5-S1 level.   Alignment: Physiologic with preservation of the normal lumbar lordosis. No listhesis.   Vertebrae: Vertebral body height maintained without acute or chronic fracture. Bone marrow signal intensity diffusely decreased on T1 weighted sequence, nonspecific, but most commonly related to anemia, smoking or obesity. No discrete or worrisome osseous lesions. Mild reactive marrow edema present about the L4-5 facets due to facet arthritis. Mild reactive endplate change noted about the T11-12 interspace anteriorly.   Conus medullaris and cauda equina: Conus extends to the L1 level. Conus and cauda equina appear normal.   Paraspinal and other soft tissues: Unremarkable.   Disc levels:   L1-2:  Unremarkable.   L2-3:  Unremarkable.   L3-4: Normal interspace. Mild right greater than left facet hypertrophy  with associated trace joint effusions. No spinal stenosis. Foramina remain patent.   L4-5: Normal interspace. Moderate bilateral facet arthrosis with associated mild reactive marrow edema and trace joint effusions. Mild epidural lipomatosis. No canal or foraminal stenosis.   L5-S1: Normal interspace. Moderate facet hypertrophy. Epidural lipomatosis. No canal or foraminal stenosis.   IMPRESSION: 1. Mild-to-moderate facet hypertrophy at L3-4 through L5-S1, most pronounced at L4-5 where there is associated mild reactive marrow edema. Finding could serve as a source for lower back pain. 2. Otherwise unremarkable MRI of the lumbar spine. No significant disc pathology, stenosis, or evidence for neural impingement.  MRI brain wo contrast (12/30/2015): FINDINGS: Axial diffusion only was performed. When accounting for superficial areas of susceptibility artifact, more prominent that unusual in the setting of multi focal falx ossification, there is no evidence of acute infarct. No hydrocephalus or shift.   IMPRESSION: Diffusion only study that is negative for infarct or other acute finding.  ASSESSMENT: TRINA ASCH is a 41 y.o. female who presents for evaluation of numbness and tingling in arms and legs. She has a relevant medical history of HTN, current smoker, obesity, DVT (on eliquis), and EtOH abuse. Her neurological examination is pertinent for diminished sensation, sensory ataxia, and wide-based gait. Available diagnostic data is significant for B12 of 1464. MRI brain in 2017 was normal.   Patient's sensory loss is patchy and does not adhere well to the typical distal to proximal gradient expected in a distal symmetric polyneuropathy, especially in the upper extremities. A sensory neuronopathy perhaps secondary to alcohol and/or an autoimmune cause (?Sjogren's) is possible. What is somewhat atypical is the sudden onset. This can happen with alcohol, but given the prior history in 2017  of left sided weakness, a demyelinating process such as MS is possible, though less likely. Patient also had DVT around the time of symptoms, so embolic infarcts are also possible, though some of the symmetry and distal predominance would not be typical. Given some of the atypical features though, I will obtain imaging (MRI brain and cervical spine) to rule out these other concerns. I will also send labs for other treatable causes of neuropathy. I will get recent records and labwork that I cannot see from [facility] as well.  PLAN: -Blood work: HbA1c, IFE -Get records from [place] -MRI brain and cervical spine w/wo contrast -Discussed EMG, but patient had in the past (2017), had a bad experience, and declined to do again -Discussed cutting back on EtOH as any would likely continue to worsen patient's symptoms -Continue gabapentin 600 mg TID for now -Recommended lidocaine cream PRN for tingling  -Return to clinic in  3 months  The impression above as well as the plan as outlined below were extensively discussed with the patient who voiced understanding. All questions were answered to their satisfaction.  When available, results of the above investigations and possible further recommendations will be communicated to the patient via telephone/MyChart. Patient to call office if not contacted after expected testing turnaround time.   Total time spent reviewing records, interview, history/exam, documentation, and coordination of care on day of encounter:  65 min   Thank you for allowing me to participate in patient's care.  If I can answer any additional questions, I would be pleased to do so.  Jacquelyne Balint, MD   CC: Hyacinth Meeker, [place], [place] [address] [place] [address]  CC: Referring provider: Collene Mares, [place] [address] [place],  [place] [address]

## 2022-09-14 ENCOUNTER — Ambulatory Visit (INDEPENDENT_AMBULATORY_CARE_PROVIDER_SITE_OTHER): Payer: Commercial Managed Care - HMO | Admitting: Neurology

## 2022-09-14 ENCOUNTER — Encounter: Payer: Self-pay | Admitting: Neurology

## 2022-09-14 ENCOUNTER — Other Ambulatory Visit (INDEPENDENT_AMBULATORY_CARE_PROVIDER_SITE_OTHER): Payer: Commercial Managed Care - HMO

## 2022-09-14 VITALS — BP 146/97 | HR 102 | Ht 64.0 in | Wt 343.0 lb

## 2022-09-14 DIAGNOSIS — F1010 Alcohol abuse, uncomplicated: Secondary | ICD-10-CM

## 2022-09-14 DIAGNOSIS — R200 Anesthesia of skin: Secondary | ICD-10-CM

## 2022-09-14 DIAGNOSIS — R202 Paresthesia of skin: Secondary | ICD-10-CM | POA: Diagnosis not present

## 2022-09-14 DIAGNOSIS — G621 Alcoholic polyneuropathy: Secondary | ICD-10-CM | POA: Diagnosis not present

## 2022-09-14 DIAGNOSIS — R29818 Other symptoms and signs involving the nervous system: Secondary | ICD-10-CM

## 2022-09-14 DIAGNOSIS — Z131 Encounter for screening for diabetes mellitus: Secondary | ICD-10-CM

## 2022-09-14 NOTE — Patient Instructions (Addendum)
Your symptoms could be due to alcohol, but I want to investigate your symptoms further: -Records release so I can get records from [place] today -MRI of your brain and cervical spine (neck)  Cut back on alcohol as this is toxic to your nerves.  We discussed EMG, but you declined.  Continue gabapentin 600 mg TID  Recommend lidocaine cream over the counter for tingling. Wear gloves so your hands aren't numb.  Return to clinic in 3 months or sooner if needed.  The physicians and staff at [facility] [place] [facility] are committed to providing excellent care. You may receive a survey requesting feedback about your experience at our office. We strive to receive "very good" responses to the survey questions. If you feel that your experience would prevent you from giving the office a "very good " response, please contact our office to try to remedy the situation. We may be reached at [phone]. Thank you for taking the time out of your busy day to complete the survey.  Kai Levins, MD [facility]

## 2022-09-15 NOTE — Addendum Note (Signed)
Addended by: Renae Gloss on: 09/15/2022 03:43 PM   Modules accepted: Orders

## 2022-09-20 ENCOUNTER — Telehealth: Payer: Self-pay

## 2022-09-20 NOTE — Telephone Encounter (Signed)
Yes I will

## 2022-09-20 NOTE — Telephone Encounter (Signed)
-----   Message from Shellia Carwin, MD sent at 09/20/2022  3:44 PM EDT ----- Regarding: Fax my clinic note Can you fax my clinic note to Dr. Carlene Coria at [facility]? She is also taking care of the patient and cannot see our notes.  Thank you,  Kai Levins, MD [facility]

## 2022-09-20 NOTE — Telephone Encounter (Signed)
Records are in Media

## 2022-09-21 ENCOUNTER — Other Ambulatory Visit: Payer: Self-pay

## 2022-09-21 NOTE — Telephone Encounter (Signed)
Done

## 2022-10-04 ENCOUNTER — Other Ambulatory Visit: Payer: Commercial Managed Care - HMO

## 2022-10-05 ENCOUNTER — Ambulatory Visit
Admission: RE | Admit: 2022-10-05 | Discharge: 2022-10-05 | Disposition: A | Discharge status: Home / Self Care | Payer: Commercial Managed Care - HMO | Source: Ambulatory Visit | Attending: Neurology | Admitting: Neurology

## 2022-10-05 ENCOUNTER — Encounter: Payer: Self-pay | Admitting: Neurology

## 2022-10-05 DIAGNOSIS — F1010 Alcohol abuse, uncomplicated: Secondary | ICD-10-CM

## 2022-10-05 DIAGNOSIS — Z131 Encounter for screening for diabetes mellitus: Secondary | ICD-10-CM

## 2022-10-05 DIAGNOSIS — R29818 Other symptoms and signs involving the nervous system: Secondary | ICD-10-CM

## 2022-10-05 DIAGNOSIS — G621 Alcoholic polyneuropathy: Secondary | ICD-10-CM

## 2022-10-05 DIAGNOSIS — R200 Anesthesia of skin: Secondary | ICD-10-CM

## 2022-10-05 MED ORDER — GADOPICLENOL 0.5 MMOL/ML IV SOLN
10.0000 mL | Freq: Once | INTRAVENOUS | Status: AC | PRN
  Administered 2022-10-05: 10 mL via INTRAVENOUS

## 2022-10-12 ENCOUNTER — Encounter: Payer: Self-pay | Admitting: Neurology

## 2022-10-26 ENCOUNTER — Encounter: Payer: Self-pay | Admitting: Neurology

## 2022-11-17 DIAGNOSIS — Z419 Encounter for procedure for purposes other than remedying health state, unspecified: Secondary | ICD-10-CM | POA: Diagnosis not present

## 2022-12-04 ENCOUNTER — Encounter (HOSPITAL_COMMUNITY): Payer: Self-pay

## 2022-12-04 ENCOUNTER — Other Ambulatory Visit: Payer: Self-pay

## 2022-12-04 ENCOUNTER — Emergency Department (HOSPITAL_COMMUNITY): Payer: Commercial Managed Care - HMO

## 2022-12-04 ENCOUNTER — Emergency Department (HOSPITAL_COMMUNITY)
Admission: EM | Admit: 2022-12-04 | Discharge: 2022-12-04 | Discharge status: Against Medical Advice | Payer: Commercial Managed Care - HMO | Attending: Physician Assistant | Admitting: Physician Assistant

## 2022-12-04 DIAGNOSIS — Z5321 Procedure and treatment not carried out due to patient leaving prior to being seen by health care provider: Secondary | ICD-10-CM | POA: Diagnosis not present

## 2022-12-04 DIAGNOSIS — Z7901 Long term (current) use of anticoagulants: Secondary | ICD-10-CM | POA: Insufficient documentation

## 2022-12-04 DIAGNOSIS — R079 Chest pain, unspecified: Secondary | ICD-10-CM | POA: Insufficient documentation

## 2022-12-04 DIAGNOSIS — M549 Dorsalgia, unspecified: Secondary | ICD-10-CM | POA: Insufficient documentation

## 2022-12-04 LAB — CBC
HCT: 40.2 % (ref 36.0–46.0)
Hemoglobin: 13.3 g/dL (ref 12.0–15.0)
MCH: 36.1 pg — ABNORMAL HIGH (ref 26.0–34.0)
MCHC: 33.1 g/dL (ref 30.0–36.0)
MCV: 109.2 fL — ABNORMAL HIGH (ref 80.0–100.0)
Platelets: 163 10*3/uL (ref 150–400)
RBC: 3.68 MIL/uL — ABNORMAL LOW (ref 3.87–5.11)
RDW: 14.3 % (ref 11.5–15.5)
WBC: 7.6 10*3/uL (ref 4.0–10.5)
nRBC: 0 % (ref 0.0–0.2)

## 2022-12-04 LAB — BASIC METABOLIC PANEL
Anion gap: 9 (ref 5–15)
BUN: <5 mg/dL — ABNORMAL LOW (ref 6–20)
CO2: 21 mmol/L — ABNORMAL LOW (ref 22–32)
Calcium: 8.3 mg/dL — ABNORMAL LOW (ref 8.9–10.3)
Chloride: 113 mmol/L — ABNORMAL HIGH (ref 98–111)
Creatinine, Ser: 0.67 mg/dL (ref 0.44–1.00)
GFR, Estimated: >60 mL/min (ref >60)
Glucose, Bld: 103 mg/dL — ABNORMAL HIGH (ref 70–99)
Potassium: 4.6 mmol/L (ref 3.5–5.1)
Sodium: 143 mmol/L (ref 135–145)

## 2022-12-04 LAB — I-STAT BETA HCG BLOOD, ED (MC, WL, AP ONLY): I-stat hCG, quantitative: <5 m[IU]/mL (ref <5)

## 2022-12-04 LAB — TROPONIN I (HIGH SENSITIVITY): Troponin I (High Sensitivity): 3 ng/L (ref <18)

## 2022-12-04 MED ORDER — ASPIRIN 81 MG PO CHEW
324.0000 mg | CHEWABLE_TABLET | Freq: Once | ORAL | Status: AC
  Administered 2022-12-04: 324 mg via ORAL
  Filled 2022-12-04: qty 4

## 2022-12-04 NOTE — ED Notes (Signed)
Pt not responding for vitals 3x  

## 2022-12-04 NOTE — ED Triage Notes (Signed)
Pt arrived POV from home c/o centralized CP that radiates to her back that started on Friday. Pt endorses SHOB, N/V. Pt states her PCP is worried about another blood clot.

## 2022-12-04 NOTE — ED Provider Triage Note (Signed)
Emergency Medicine Provider Triage Evaluation Note  LISAANNE LAWRIE , a 41 y.o. female  was evaluated in triage.  Pt complains of pain in left chest and back, history of DVT on anticoagulation. Her PCP told her to come in for concern of PE. No headache, dizziness, syncope, fever.   Review of Systems  Positive: Chest pain Negative: Dizziness, weakness, numbness  Physical Exam  There were no vitals taken for this visit. Gen:   Awake, no distress   Resp:  Normal effort  MSK:   Moves extremities without difficulty  Other:    Medical Decision Making  Medically screening exam initiated at 10:40 AM.  Appropriate orders placed.  Lateesha M Zhao was informed that the remainder of the evaluation will be completed by another provider, this initial triage assessment does not replace that evaluation, and the importance of remaining in the ED until their evaluation is complete.     Ma Rings, [place] 12/04/22 1041

## 2022-12-07 NOTE — Progress Notes (Signed)
NEUROLOGY FOLLOW UP OFFICE NOTE  Sonya Hurst [id number]  Subjective:  Sonya Hurst is a 41 y.o. year old right-handed female with a medical history of Sjogren's syndrome, HTN, current smoker, obesity, DVT (on eliquis), and EtOH abuse who we last saw on 09/14/22.  To briefly review: Symptoms have been present since 05/23/22. She woke up and her legs and hands felt tight with sharp pains in her feet. She numbness was in her legs below the knee and in her hands. She was having trouble walking and was falling. When she stands, she has to make sure she knows where they are. She has had about 6-7 falls since 05/23/22. She does not use an assistive device to help walk.   She endorses being off balance particularly when she closes her eyes or has her eyes covered (taking a shower or putting on a shirt).   She denies any symptoms prior to 05/23/22. Patient was in the hospital the day before. She had a DVT at that visit. She has been seen at the ED and been told she has neuropathy.   Patient is currently on Gabapentin 600 mg TID. She feels like taking the gabapentin may intensify her symptoms, then her symptoms improve after 40 minutes. She briefly switched gabapentin to Lyrica, but this made her feet swell, so she was put back on gabapentin. She feels like the numbness and tingling are about the same, but her walking has improved since 05/23/22. She went to physical therapy for the gait imbalance. She stopped about 1 month ago. She did feel like that helped.   Patient has seen rheumatology, Dr. Drenda Freeze. Patient has a positive ANA, SSA, and SSB.   The patient has not had similar episodes of symptoms in the past. She has not had previous episodes of losing vision in one eye. She did have an episode of the left side of her body acutely becoming weak in 2017. It  took about 2 weeks for function to return. She has no residual deficits.   EtOH use: Currently drinks 1.5 bottles of wine per day. 1-2  handles of liquor per day was prior to June Restrictive diet? No Family history of neuropathy/myopathy/NM disease? No   Of note, patient saw Dr. Terrace Arabia at [facility] in 2017 after her left sided weakness episode. Per patient, she had NCS at that time but was told that was normal (I do not see this in the chart). She was told her left sided symptoms were related to migraine per patient.  Most recent Assessment and Plan (09/14/22): Her neurological examination is pertinent for diminished sensation, sensory ataxia, and wide-based gait. Available diagnostic data is significant for B12 of 1464. MRI brain in 2017 was normal.    Patient's sensory loss is patchy and does not adhere well to the typical distal to proximal gradient expected in a distal symmetric polyneuropathy, especially in the upper extremities. A sensory neuronopathy perhaps secondary to alcohol and/or an autoimmune cause (?Sjogren's) is possible. What is somewhat atypical is the sudden onset. This can happen with alcohol, but given the prior history in 2017 of left sided weakness, a demyelinating process such as MS is possible, though less likely. Patient also had DVT around the time of symptoms, so embolic infarcts are also possible, though some of the symmetry and distal predominance would not be typical. Given some of the atypical features though, I will obtain imaging (MRI brain and cervical spine) to rule out these other concerns. I will  also send labs for other treatable causes of neuropathy. I will get recent records and labwork that I cannot see from [facility] as well.   PLAN: -Blood work: HbA1c, IFE -Get records from [facility] -obtained -MRI brain and cervical spine w/wo contrast -Discussed EMG, but patient had in the past (2017), had a bad experience, and declined to do again -Discussed cutting back on EtOH as any would likely continue to worsen patient's symptoms -Continue gabapentin 600 mg TID  for now -Recommended lidocaine cream PRN for tingling  Since their last visit: Records from rheumatology showed that patient had a positive ANA, SSA, and SSB abs. Symptoms were not clearly rheumatologic though per notes. MRI of brain and cervical spine were normal. Patient did not get labs after last visit.  Patient is walking better. She can stand, but can still get wobbly. She has had no falls. She completed physical therapy. She walks frequently at work.   Patient continues to take gabapentin. She only takes it when she has bad tingling/pain. She took it one time last week. She mentions gabapentin was making her hair thin and fall out. She also uses lidocaine cream.  She drinks 3-4 glasses of wine per day currently.  MEDICATIONS:  Outpatient Encounter Medications as of 12/20/2022  Medication Sig   APIXABAN (ELIQUIS) VTE STARTER PACK (10MG  AND 5MG ) Take as directed on package: start with two-5mg  tablets twice daily for 7 days. On day 8, switch to one-5mg  tablet twice daily. (Patient taking differently: one-5mg  tablet twice daily.)   gabapentin (NEURONTIN) 600 MG tablet Take 600 mg by mouth 3 (three) times daily.   methocarbamol (ROBAXIN) 750 MG tablet Take 750 mg by mouth every 8 (eight) hours as needed.   phentermine (ADIPEX-P) 37.5 MG tablet Take 37.5 mg by mouth every morning.   amLODipine (NORVASC) 5 MG tablet Take 1 tablet (5 mg total) by mouth daily. (Patient not taking: Reported on 12/20/2022)   calcium carbonate (TUMS - DOSED IN MG ELEMENTAL CALCIUM) 500 MG chewable tablet Chew 2 tablets by mouth daily as needed for indigestion or heartburn. (Patient not taking: Reported on 12/20/2022)   lisinopril (ZESTRIL) 5 MG tablet Take 1 tablet (5 mg total) by mouth daily. (Patient not taking: Reported on 12/20/2022)   propranolol ER (INDERAL LA) 60 MG 24 hr capsule Take 60 mg by mouth daily. (Patient not taking: Reported on 12/20/2022)   No facility-administered encounter medications on file as of  12/20/2022.    PAST MEDICAL HISTORY: Past Medical History:  Diagnosis Date   Hypertension    Migraines    Neuropathy    Obesity     PAST SURGICAL HISTORY: Past Surgical History:  Procedure Laterality Date   ESSURE TUBAL LIGATION  2008   FINGER FRACTURE SURGERY      ALLERGIES: Allergies  Allergen Reactions   Pataday [Olopatadine Hcl] Swelling    FAMILY HISTORY: History reviewed. No pertinent family history.  SOCIAL HISTORY: Social History   Tobacco Use   Smoking status: Every Day    Packs/day: 0.25    Years: 18.00    Total pack years: 4.50    Types: Cigarettes   Smokeless tobacco: Current   Tobacco comments:    Smoke about 5 per day.  Vaping Use   Vaping Use: Never used  Substance Use Topics   Alcohol use: Yes    Alcohol/week: 0.0 standard drinks of alcohol   Drug use: No   Social History   Social History Narrative   Denies  caffeine use.      Objective:  Vital Signs:  BP 136/87   Pulse (!) 107   Ht 5\' 4"  (1.626 m)   Wt (!) 328 lb 12.8 oz (149.1 kg)   SpO2 98%   BMI 56.44 kg/m   General: General appearance: Awake and alert. No distress. Cooperative with exam.  Skin: No obvious rash or jaundice. HEENT: Atraumatic. Anicteric. Lungs: Non-labored breathing on room air  Extremities: No significant edema. No obvious deformity.  Psych: Affect appropriate.  Neurological: Mental Status: Alert. Speech fluent. No pseudobulbar affect Cranial Nerves: CNII: No RAPD. Visual fields intact. CNIII, IV, VI: PERRL. No nystagmus. EOMI. CN V: Facial sensation intact bilaterally to fine touch. Masseter clench strong. CN VII: Facial muscles symmetric and strong. No ptosis at rest. CN VIII: Hears finger rub well bilaterally. CN IX: No hypophonia. CN X: Palate elevates symmetrically. CN XI: Full strength shoulder shrug bilaterally. CN XII: Tongue protrusion full and midline. No atrophy or fasciculations. No significant dysarthria Motor: Tone is  normal.  Individual muscle group testing (MRC grade out of 5):  Movement     Neck flexion 5    Neck extension 5     Right Left   Shoulder abduction 5 5   Elbow flexion 5 5   Elbow extension 5 5   Finger extension 5 5   Finger flexion 5 5    Hip flexion 5 5   Knee extension 5 5   Knee flexion 5 5   Dorsiflexion 5 5   Plantarflexion 5 5     Reflexes:  Right Left   Bicep 2+ 2+   Tricep 2+ 2+   BrRad 2+ 2+   Knee 1+ 1+   Ankle 0 0    Sensation: Pinprick: 50% of normal in bilateral feet with improvement to normal above bilateral calves Coordination: Intact finger-to- nose-finger bilaterally. Romberg with mild sway. Gait: Normal based gait. Stable.  Labs and Imaging review: New results: 12/04/22: CBC significant for MCV of 109.2 (chronically elevated)  MRI brain and cervical spine w/wo contrast (10/05/22): FINDINGS: MRI HEAD FINDINGS   Brain: No acute infarction, hemorrhage, hydrocephalus, extra-axial collection or mass lesion. No white matter disease. No pathologic enhancement.   Vascular: Major arterial voids are maintained at the skull base.   Skull and upper cervical spine: Normal marrow signal.   Sinuses/Orbits: Minimal paranasal sinus mucosal thickening. No acute orbital findings.   Other: No mastoid effusions.   MRI CERVICAL SPINE FINDINGS   Alignment: Mild reversal the normal cervical lordosis. No substantial sagittal subluxation.   Vertebrae: Vertebral body heights are maintained. No marrow edema to suggest acute fracture or discitis/osteomyelitis. No suspicious bone lesions   Cord: Normal cord signal.  No abnormal cord enhancement.   Posterior Fossa, vertebral arteries, paraspinal tissues: Visualized vertebral artery flow voids are maintained.   Disc levels: Motion limited without evidence of significant disc protrusion, foraminal stenosis, or canal stenosis.   IMPRESSION: 1. Normal brain MRI.  No evidence of acute intracranial  abnormality. 2. Unremarkable MRI of the cervical spine. No significant stenosis and normal cord signal.  Previously reviewed results: Internal labs: Normal or unremarkable: TSH BMP significant for mildly elevated glucose LFTs mildly elevated (AST > ALT) CBC significant for MCV of 111.3; platelets 141 B12 (05/18/22): 1464   MRI lumbar spine wo contrast (08/20/22): FINDINGS: Segmentation: Standard. Lowest well-formed disc space labeled the L5-S1 level.   Alignment: Physiologic with preservation of the normal lumbar lordosis. No listhesis.   Vertebrae:  Vertebral body height maintained without acute or chronic fracture. Bone marrow signal intensity diffusely decreased on T1 weighted sequence, nonspecific, but most commonly related to anemia, smoking or obesity. No discrete or worrisome osseous lesions. Mild reactive marrow edema present about the L4-5 facets due to facet arthritis. Mild reactive endplate change noted about the T11-12 interspace anteriorly.   Conus medullaris and cauda equina: Conus extends to the L1 level. Conus and cauda equina appear normal.   Paraspinal and other soft tissues: Unremarkable.   Disc levels:   L1-2:  Unremarkable.   L2-3:  Unremarkable.   L3-4: Normal interspace. Mild right greater than left facet hypertrophy with associated trace joint effusions. No spinal stenosis. Foramina remain patent.   L4-5: Normal interspace. Moderate bilateral facet arthrosis with associated mild reactive marrow edema and trace joint effusions. Mild epidural lipomatosis. No canal or foraminal stenosis.   L5-S1: Normal interspace. Moderate facet hypertrophy. Epidural lipomatosis. No canal or foraminal stenosis.   IMPRESSION: 1. Mild-to-moderate facet hypertrophy at L3-4 through L5-S1, most pronounced at L4-5 where there is associated mild reactive marrow edema. Finding could serve as a source for lower back pain. 2. Otherwise unremarkable MRI of the lumbar spine.  No significant disc pathology, stenosis, or evidence for neural impingement.   MRI brain wo contrast (12/30/2015): FINDINGS: Axial diffusion only was performed. When accounting for superficial areas of susceptibility artifact, more prominent that unusual in the setting of multi focal falx ossification, there is no evidence of acute infarct. No hydrocephalus or shift.   IMPRESSION: Diffusion only study that is negative for infarct or other acute finding.  Assessment/Plan:  This is Sonya Hurst, a 41 y.o. 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 She does have positive SSA and SSB antibodies, so a Sjogren's sensory neuronopathy could also be contributing, however patient has improved with cutting down on alcohol and PT, without treatment for Sjogren's syndrome. I will look for other treatable causes as below as patient did not have her labs drawn after last appointment.  Plan: -Blood work: B1, A1c, IFE -Continue home exercises from PT -Discussed continuing to cut down on EtOH as no EtOH would improve nerve healing -Again discussed, but patient not interested in EMG due to prior bad experience with it -Continue gabapentin 600 mg PRN  Return to clinic in 6 months  Total time spent reviewing records, interview, history/exam, documentation, and coordination of care on day of encounter:  30 min  Jacquelyne Balint, MD

## 2022-12-14 ENCOUNTER — Ambulatory Visit: Payer: Commercial Managed Care - HMO | Admitting: Neurology

## 2022-12-18 DIAGNOSIS — Z419 Encounter for procedure for purposes other than remedying health state, unspecified: Secondary | ICD-10-CM | POA: Diagnosis not present

## 2022-12-20 ENCOUNTER — Encounter: Payer: Self-pay | Admitting: Neurology

## 2022-12-20 ENCOUNTER — Other Ambulatory Visit (INDEPENDENT_AMBULATORY_CARE_PROVIDER_SITE_OTHER): Payer: 59

## 2022-12-20 ENCOUNTER — Ambulatory Visit (INDEPENDENT_AMBULATORY_CARE_PROVIDER_SITE_OTHER): Payer: 59 | Admitting: Neurology

## 2022-12-20 VITALS — BP 136/87 | HR 107 | Ht 64.0 in | Wt 328.8 lb

## 2022-12-20 DIAGNOSIS — R200 Anesthesia of skin: Secondary | ICD-10-CM

## 2022-12-20 DIAGNOSIS — G621 Alcoholic polyneuropathy: Secondary | ICD-10-CM

## 2022-12-20 DIAGNOSIS — F1010 Alcohol abuse, uncomplicated: Secondary | ICD-10-CM

## 2022-12-20 DIAGNOSIS — R2681 Unsteadiness on feet: Secondary | ICD-10-CM | POA: Diagnosis not present

## 2022-12-20 DIAGNOSIS — Z131 Encounter for screening for diabetes mellitus: Secondary | ICD-10-CM | POA: Diagnosis not present

## 2022-12-20 DIAGNOSIS — R29818 Other symptoms and signs involving the nervous system: Secondary | ICD-10-CM

## 2022-12-20 DIAGNOSIS — R202 Paresthesia of skin: Secondary | ICD-10-CM | POA: Diagnosis not present

## 2022-12-20 LAB — HEMOGLOBIN A1C: Hgb A1c MFr Bld: 5.7 % (ref 4.6–6.5)

## 2022-12-20 NOTE — Patient Instructions (Signed)
Blood work today. I will be in touch when I have the results.  Continue staying active.  Cut back on alcohol.  Follow up with rheumatology.  Return to clinic in 6 months.  The physicians and staff at [facility] are committed to providing excellent care. You may receive a survey requesting feedback about your experience at our office. We strive to receive "very good" responses to the survey questions. If you feel that your experience would prevent you from giving the office a "very good " response, please contact our office to try to remedy the situation. We may be reached at [phone]. Thank you for taking the time out of your busy day to complete the survey.  Kai Levins, MD [facility]  Preventing Falls at [employer] are common, often dreaded events in the lives of older people. Aside from the obvious injuries and even death that may result, fall can cause wide-ranging consequences including loss of independence, mental decline, decreased activity and mobility. Younger people are also at risk of falling, especially those with chronic illnesses and fatigue.  Ways to reduce risk for falling Examine diet and medications. Warm foods and alcohol dilate blood vessels, which can lead to dizziness when standing. Sleep aids, antidepressants and pain medications can also increase the likelihood of a fall.  Get a vision exam. Poor vision, cataracts and glaucoma increase the chances of falling.  Check foot gear. Shoes should fit snugly and have a sturdy, nonskid sole and a broad, low heel  Participate in a physician-approved exercise program to build and maintain muscle strength and improve balance and coordination. Programs that use ankle weights or stretch bands are excellent for muscle-strengthening. Water aerobics programs and low-impact Tai Chi programs have also been shown to improve balance and coordination.  Increase vitamin D intake. Vitamin D improves muscle strength and  increases the amount of calcium the body is able to absorb and deposit in bones.  How to prevent falls from common hazards Floors - Remove all loose wires, cords, and throw rugs. Minimize clutter. Make sure rugs are anchored and smooth. Keep furniture in its usual place.  Chairs -- Use chairs with straight backs, armrests and firm seats. Add firm cushions to existing pieces to add height.  Bathroom - Install grab bars and non-skid tape in the tub or shower. Use a bathtub transfer bench or a shower chair with a back support Use an elevated toilet seat and/or safety rails to assist standing from a low surface. Do not use towel racks or bathroom tissue holders to help you stand.  Lighting - Make sure halls, stairways, and entrances are well-lit. Install a night light in your bathroom or hallway. Make sure there is a light switch at the top and bottom of the staircase. Turn lights on if you get up in the middle of the night. Make sure lamps or light switches are within reach of the bed if you have to get up during the night.  Kitchen - Install non-skid rubber mats near the sink and stove. Clean spills immediately. Store frequently used utensils, pots, pans between waist and eye level. This helps prevent reaching and bending. Sit when getting things out of lower cupboards.  Living room/ Bedrooms - Place furniture with wide spaces in between, giving enough room to move around. Establish a route through the living room that gives you something to hold onto as you walk.  Stairs - Make sure treads, rails, and rugs are secure. Install a rail  on both sides of the stairs. If stairs are a threat, it might be helpful to arrange most of your activities on the lower level to reduce the number of times you must climb the stairs.  Entrances and doorways - Install metal handles on the walls adjacent to the doorknobs of all doors to make it more secure as you travel through the doorway.  Tips for maintaining  balance Keep at least one hand free at all times. Try using a backpack or fanny pack to hold things rather than carrying them in your hands. Never carry objects in both hands when walking as this interferes with keeping your balance.  Attempt to swing both arms from front to back while walking. This might require a conscious effort if Parkinson's disease has diminished your movement. It will, however, help you to maintain balance and posture, and reduce fatigue.  Consciously lift your feet off of the ground when walking. Shuffling and dragging of the feet is a common culprit in losing your balance.  When trying to navigate turns, use a "U" technique of facing forward and making a wide turn, rather than pivoting sharply.  Try to stand with your feet shoulder-length apart. When your feet are close together for any length of time, you increase your risk of losing your balance and falling.  Do one thing at a time. Don't try to walk and accomplish another task, such as reading or looking around. The decrease in your automatic reflexes complicates motor function, so the less distraction, the better.  Do not wear rubber or gripping soled shoes, they might "catch" on the floor and cause tripping.  Move slowly when changing positions. Use deliberate, concentrated movements and, if needed, use a grab bar or walking aid. Count 15 seconds between each movement. For example, when rising from a seated position, wait 15 seconds after standing to begin walking.  If balance is a continuous problem, you might want to consider a walking aid such as a cane, walking stick, or walker. Once you've mastered walking with help, you might be ready to try it on your own again.

## 2022-12-21 ENCOUNTER — Telehealth: Payer: Self-pay

## 2022-12-21 NOTE — Telephone Encounter (Signed)
Mychart msg sent. AS, CMA 

## 2022-12-26 LAB — IMMUNOFIXATION ELECTROPHORESIS
IgG (Immunoglobin G), Serum: 1229 mg/dL (ref 600–1640)
IgM, Serum: 227 mg/dL (ref 50–300)
Immunoglobulin A: 475 mg/dL — ABNORMAL HIGH (ref 47–310)

## 2022-12-26 LAB — VITAMIN B1: Vitamin B1 (Thiamine): <6 nmol/L — ABNORMAL LOW (ref 8–30)

## 2023-01-18 DIAGNOSIS — Z419 Encounter for procedure for purposes other than remedying health state, unspecified: Secondary | ICD-10-CM | POA: Diagnosis not present

## 2023-02-07 DIAGNOSIS — Z113 Encounter for screening for infections with a predominantly sexual mode of transmission: Secondary | ICD-10-CM | POA: Diagnosis not present

## 2023-02-07 DIAGNOSIS — Z1159 Encounter for screening for other viral diseases: Secondary | ICD-10-CM | POA: Diagnosis not present

## 2023-02-07 DIAGNOSIS — I10 Essential (primary) hypertension: Secondary | ICD-10-CM | POA: Diagnosis not present

## 2023-02-16 DIAGNOSIS — Z419 Encounter for procedure for purposes other than remedying health state, unspecified: Secondary | ICD-10-CM | POA: Diagnosis not present

## 2023-02-22 ENCOUNTER — Ambulatory Visit: Payer: PRIVATE HEALTH INSURANCE | Admitting: Radiology

## 2023-03-19 DIAGNOSIS — Z419 Encounter for procedure for purposes other than remedying health state, unspecified: Secondary | ICD-10-CM | POA: Diagnosis not present

## 2023-06-06 NOTE — Progress Notes (Signed)
NEUROLOGY FOLLOW UP OFFICE NOTE  Sonya Hurst [id number]  Subjective:  Sonya Hurst is a 42 y.o. year old right-handed female with a medical history of Sjogren's syndrome, HTN, current smoker, obesity, DVT (on eliquis), and EtOH abuse who we last saw on 12/20/22.  To briefly review: Initial consult (09/14/22): Symptoms have been present since 05/23/22. She woke up and her legs and hands felt tight with sharp pains in her feet. She numbness was in her legs below the knee and in her hands. She was having trouble walking and was falling. When she stands, she has to make sure she knows where they are. She has had about 6-7 falls since 05/23/22. She does not use an assistive device to help walk.   She endorses being off balance particularly when she closes her eyes or has her eyes covered (taking a shower or putting on a shirt).   She denies any symptoms prior to 05/23/22. Patient was in the hospital the day before. She had a DVT at that visit. She has been seen at the ED and been told she has neuropathy.   Patient is currently on Gabapentin 600 mg TID. She feels like taking the gabapentin may intensify her symptoms, then her symptoms improve after 40 minutes. She briefly switched gabapentin to Lyrica, but this made her feet swell, so she was put back on gabapentin. She feels like the numbness and tingling are about the same, but her walking has improved since 05/23/22. She went to physical therapy for the gait imbalance. She stopped about 1 month ago. She did feel like that helped.   Patient has seen rheumatology, Dr. Drenda Freeze. Patient has a positive ANA, SSA, and SSB.   The patient has not had similar episodes of symptoms in the past. She has not had previous episodes of losing vision in one eye. She did have an episode of the left side of her body acutely becoming weak in 2017. It  took about 2 weeks for function to return. She has no residual deficits.   EtOH use: Currently drinks 1.5 bottles  of wine per day. 1-2 handles of liquor per day was prior to June Restrictive diet? No Family history of neuropathy/myopathy/NM disease? No   Of note, patient saw Dr. Terrace Arabia at [facility] neurolog[facility] in 2017 after her left sided weakness episode. Per patient, she had NCS at that time but was told that was normal (I do not see this in the chart). She was told her left sided symptoms were related to migraine per patient.  12/20/22: Records from rheumatology showed that patient had a positive ANA, SSA, and SSB abs. Symptoms were not clearly rheumatologic though per notes. MRI of brain and cervical spine were normal. Patient did not get labs after last visit.   Patient is walking better. She can stand, but can still get wobbly. She has had no falls. She completed physical therapy. She walks frequently at work.    Patient continues to take gabapentin. She only takes it when she has bad tingling/pain. She took it one time last week. She mentions gabapentin was making her hair thin and fall out. She also uses lidocaine cream.   She drinks 3-4 glasses of wine per day currently.  Most recent Assessment and Plan (12/20/22): This is Sonya Hurst, a 42 y.o. 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 She does have positive SSA and SSB antibodies, so a Sjogren's sensory  neuronopathy could also be contributing, however patient has improved with cutting down on alcohol and PT, without treatment for Sjogren's syndrome. I will look for other treatable causes as below as patient did not have her labs drawn after last appointment.   Plan: -Blood work: B1, A1c, IFE -Continue home exercises from PT -Discussed continuing to cut down on EtOH as no EtOH would improve nerve healing -Again discussed, but patient not interested in EMG due to prior bad experience with it -Continue gabapentin 600 mg PRN  Since their last visit: B1 was low (<6). I  recommended supplementation with B1 100 mg daily. She is not taking this. Her IFE was indeterminate. Her A1c was 5.7.   Patient is having swelling of her left leg. The right leg does not swell as much. She is wearing compression stockings. She also mentions that she feels her left toes are dragging. She thinks the numbness and tingling in arms and legs are about the same. She does think she is walking better.  She has also had bowel and bladder loss, about 5 times, but only at night. She will wake up and notice the accident in the bed. She has had some accidents during the day, but when she is trying to get to the bathroom, like she can't get there fast enough. She denies saddle anesthesia. She has only infrequent back pain.  Patient has had one fall since last visit. She got dizzy and fell.  Patient is taking gabapentin 600 mg at bedtime. It makes her sleepy. She feels good in the morning, but has pain during the day. 600 mg makes her too sleepy to take during the day usually. It helps when she takes it.  Patient is drinking 1 bottle of wine per day. She feels like this helps her pain.   MEDICATIONS:  Outpatient Encounter Medications as of 06/20/2023  Medication Sig   APIXABAN (ELIQUIS) VTE STARTER PACK (10MG  AND 5MG ) Take as directed on package: start with two-5mg  tablets twice daily for 7 days. On day 8, switch to one-5mg  tablet twice daily. (Patient taking differently: one-5mg  tablet twice daily.)   gabapentin (NEURONTIN) 600 MG tablet Take 600 mg by mouth 3 (three) times daily.   lisinopril (ZESTRIL) 5 MG tablet Take 1 tablet (5 mg total) by mouth daily.   methocarbamol (ROBAXIN) 750 MG tablet Take 750 mg by mouth every 8 (eight) hours as needed.   phentermine (ADIPEX-P) 37.5 MG tablet Take 37.5 mg by mouth every morning.   [DISCONTINUED] amLODipine (NORVASC) 5 MG tablet Take 1 tablet (5 mg total) by mouth daily. (Patient not taking: Reported on 12/20/2022)   [DISCONTINUED] calcium carbonate  (TUMS - DOSED IN MG ELEMENTAL CALCIUM) 500 MG chewable tablet Chew 2 tablets by mouth daily as needed for indigestion or heartburn. (Patient not taking: Reported on 12/20/2022)   [DISCONTINUED] propranolol ER (INDERAL LA) 60 MG 24 hr capsule Take 60 mg by mouth daily. (Patient not taking: Reported on 12/20/2022)   No facility-administered encounter medications on file as of 06/20/2023.    PAST MEDICAL HISTORY: Past Medical History:  Diagnosis Date   Hypertension    Migraines    Neuropathy    Obesity     PAST SURGICAL HISTORY: Past Surgical History:  Procedure Laterality Date   ESSURE TUBAL LIGATION  2008   FINGER FRACTURE SURGERY      ALLERGIES: Allergies  Allergen Reactions   Pataday [Olopatadine Hcl] Swelling    FAMILY HISTORY: No family history on file.  SOCIAL HISTORY: Social History   Tobacco Use   Smoking status: Every Day    Packs/day: 0.25    Years: 18.00    Additional pack years: 0.00    Total pack years: 4.50    Types: Cigarettes   Smokeless tobacco: Current   Tobacco comments:    Smoke about 5 per day.  Vaping Use   Vaping Use: Never used  Substance Use Topics   Alcohol use: Yes    Alcohol/week: 0.0 standard drinks of alcohol   Drug use: No   Social History   Social History Narrative   Denies caffeine use.      Objective:  Vital Signs:  BP (!) 144/85   Pulse 77   Ht 5\' 4"  (1.626 m)   Wt (!) 320 lb 9.6 oz (145.4 kg)   SpO2 97%   BMI 55.03 kg/m   General: General appearance: Awake and alert. No distress. Cooperative with exam.  Skin: No obvious rash or jaundice. HEENT: Atraumatic. Anicteric. Lungs: Non-labored breathing on room air  Extremities: Mild edema in bilateral lower extremities. Psych: Affect appropriate.  Neurological: Mental Status: Alert. Speech fluent. No pseudobulbar affect Cranial Nerves: CNII: No RAPD. Visual fields intact. CNIII, IV, VI: PERRL. No nystagmus. EOMI. CN V: Facial sensation intact bilaterally to fine  touch. CN VII: Facial muscles symmetric and strong. No ptosis at rest. CN VIII: Hears finger rub well bilaterally. CN IX: No hypophonia. CN X: Palate elevates symmetrically. CN XI: Full strength shoulder shrug bilaterally. CN XII: Tongue protrusion full and midline. No atrophy or fasciculations. No significant dysarthria Motor: Tone is normal.  Individual muscle group testing (MRC grade out of 5):  Movement     Neck flexion 5    Neck extension 5     Right Left   Shoulder abduction 5 5   Elbow flexion 5 5   Elbow extension 5 5   Wrist extension 5 5   Wrist flexion 5 5   Finger abduction - FDI 5 5   Finger abduction - ADM 5 5   Finger extension 5 5   Finger distal flexion - 2/3 5 5    Finger distal flexion - 4/5 5 5    Thumb flexion - FPL 5 5   Thumb abduction - APB 5 5    Hip flexion 5 5   Hip extension 5 5   Hip adduction 5 5   Hip abduction 5 5   Knee extension 5 5   Knee flexion 5 5   Dorsiflexion 5 5   Plantarflexion 5 5    Reflexes:  Right Left   Bicep 2+ 2+   Tricep 2+ 2+   BrRad 2+ 2+   Knee 1+ 1+   Ankle 0 0    Sensation: Pinprick: Diminished in medial aspect of left lower leg, otherwise intact Coordination: Intact finger-to- nose-finger bilaterally. Gait: Normal, narrow-based gait   Labs and Imaging review: New results: 12/20/22: B1 < 6 HbA1c: 5.7 IFE: A poorly-defined area of restricted protein mobility is detected  and is reactive with IgA and lambda antisera.   Previously reviewed results: 12/04/22: CBC significant for MCV of 109.2 (chronically elevated)  Normal or unremarkable: TSH BMP significant for mildly elevated glucose LFTs mildly elevated (AST > ALT) CBC significant for MCV of 111.3; platelets 141 B12 (05/18/22): 1464   MRI brain and cervical spine w/wo contrast (10/05/22): FINDINGS: MRI HEAD FINDINGS   Brain: No acute infarction, hemorrhage, hydrocephalus, extra-axial collection or mass lesion. No white  matter disease. No  pathologic enhancement.   Vascular: Major arterial voids are maintained at the skull base.   Skull and upper cervical spine: Normal marrow signal.   Sinuses/Orbits: Minimal paranasal sinus mucosal thickening. No acute orbital findings.   Other: No mastoid effusions.   MRI CERVICAL SPINE FINDINGS   Alignment: Mild reversal the normal cervical lordosis. No substantial sagittal subluxation.   Vertebrae: Vertebral body heights are maintained. No marrow edema to suggest acute fracture or discitis/osteomyelitis. No suspicious bone lesions   Cord: Normal cord signal.  No abnormal cord enhancement.   Posterior Fossa, vertebral arteries, paraspinal tissues: Visualized vertebral artery flow voids are maintained.   Disc levels: Motion limited without evidence of significant disc protrusion, foraminal stenosis, or canal stenosis.   IMPRESSION: 1. Normal brain MRI.  No evidence of acute intracranial abnormality. 2. Unremarkable MRI of the cervical spine. No significant stenosis and normal cord signal.   MRI lumbar spine wo contrast (08/20/22): FINDINGS: Segmentation: Standard. Lowest well-formed disc space labeled the L5-S1 level.   Alignment: Physiologic with preservation of the normal lumbar lordosis. No listhesis.   Vertebrae: Vertebral body height maintained without acute or chronic fracture. Bone marrow signal intensity diffusely decreased on T1 weighted sequence, nonspecific, but most commonly related to anemia, smoking or obesity. No discrete or worrisome osseous lesions. Mild reactive marrow edema present about the L4-5 facets due to facet arthritis. Mild reactive endplate change noted about the T11-12 interspace anteriorly.   Conus medullaris and cauda equina: Conus extends to the L1 level. Conus and cauda equina appear normal.   Paraspinal and other soft tissues: Unremarkable.   Disc levels:   L1-2:  Unremarkable.   L2-3:  Unremarkable.   L3-4: Normal  interspace. Mild right greater than left facet hypertrophy with associated trace joint effusions. No spinal stenosis. Foramina remain patent.   L4-5: Normal interspace. Moderate bilateral facet arthrosis with associated mild reactive marrow edema and trace joint effusions. Mild epidural lipomatosis. No canal or foraminal stenosis.   L5-S1: Normal interspace. Moderate facet hypertrophy. Epidural lipomatosis. No canal or foraminal stenosis.   IMPRESSION: 1. Mild-to-moderate facet hypertrophy at L3-4 through L5-S1, most pronounced at L4-5 where there is associated mild reactive marrow edema. Finding could serve as a source for lower back pain. 2. Otherwise unremarkable MRI of the lumbar spine. No significant disc pathology, stenosis, or evidence for neural impingement.   MRI brain wo contrast (12/30/2015): FINDINGS: Axial diffusion only was performed. When accounting for superficial areas of susceptibility artifact, more prominent that unusual in the setting of multi focal falx ossification, there is no evidence of acute infarct. No hydrocephalus or shift.   IMPRESSION: Diffusion only study that is negative for infarct or other acute finding.  Assessment/Plan:  This is Sonya Hurst, a 42 y.o. 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 She had low B1 (12/2022) which is likely contributing. She also has positive SSA and SSB antibodies, so a Sjogren's sensory neuronopathy could also be contributing. She continues to drink 1 bottle of wine per day and has not started B1 supplementation. I have discussed EMG with patient, but she had a bad experience in the past and is not interested in repeating. Her more concerning symptom is a more focal numbness in the left leg and loss of bowel and bladder, though only when sleeping. I will get an MRI of lumbar spine to evaluate for conus, cauda equina, or  radiculopathy.  Plan: -  MRI lumbar spine wo contrast -Repeat IFE, vit D -Start B1 (thiamine) 100 mg daily -Continue home exercises from PT -Discussed continuing to cut down on EtOH as no EtOH would improve nerve healing -Again discussed, but patient not interested in EMG due to prior bad experience with it -Gabapentin 300 mg in afternoon and 600 mg at bedtime  Return to clinic in 6 months  Total time spent reviewing records, interview, history/exam, documentation, and coordination of care on day of encounter:  30 min  Jacquelyne Balint, MD

## 2023-06-20 ENCOUNTER — Ambulatory Visit (INDEPENDENT_AMBULATORY_CARE_PROVIDER_SITE_OTHER): Payer: Medicaid Other | Admitting: Neurology

## 2023-06-20 ENCOUNTER — Encounter: Payer: Self-pay | Admitting: Neurology

## 2023-06-20 ENCOUNTER — Other Ambulatory Visit (INDEPENDENT_AMBULATORY_CARE_PROVIDER_SITE_OTHER): Payer: Medicaid Other

## 2023-06-20 VITALS — BP 144/85 | HR 77 | Ht 64.0 in | Wt 320.6 lb

## 2023-06-20 DIAGNOSIS — R2681 Unsteadiness on feet: Secondary | ICD-10-CM | POA: Diagnosis not present

## 2023-06-20 DIAGNOSIS — R202 Paresthesia of skin: Secondary | ICD-10-CM

## 2023-06-20 DIAGNOSIS — R200 Anesthesia of skin: Secondary | ICD-10-CM | POA: Diagnosis not present

## 2023-06-20 DIAGNOSIS — F1010 Alcohol abuse, uncomplicated: Secondary | ICD-10-CM

## 2023-06-20 DIAGNOSIS — G621 Alcoholic polyneuropathy: Secondary | ICD-10-CM

## 2023-06-20 DIAGNOSIS — M545 Low back pain, unspecified: Secondary | ICD-10-CM

## 2023-06-20 DIAGNOSIS — E519 Thiamine deficiency, unspecified: Secondary | ICD-10-CM

## 2023-06-20 LAB — VITAMIN D 25 HYDROXY (VIT D DEFICIENCY, FRACTURES): VITD: 7.2 ng/mL — ABNORMAL LOW (ref 30.00–100.00)

## 2023-06-20 MED ORDER — THIAMINE HCL 100 MG PO TABS: 100.0000 mg | ORAL_TABLET | Freq: Every day | ORAL | 11 refills | Status: DC

## 2023-06-20 MED ORDER — GABAPENTIN 300 MG PO CAPS: 300.0000 mg | ORAL_CAPSULE | Freq: Every day | ORAL | 11 refills | Status: DC

## 2023-06-20 NOTE — Patient Instructions (Addendum)
-  Start B1 (thiamine) 100 mg daily  -Blood work today -MRI of your low back (lumbar spine)  -Continue home exercises from PT -Discussed continuing to cut down on EtOH as no EtOH would improve nerve healing -Again discussed, but you are not interested in EMG due to prior bad experience with it  You will take gabapentin 300 mg around mid day and 600 mg at bedtime. I sent the 300 mg capsules to your pharmacy.  Follow up in 6 months  The physicians and staff at [facility] are committed to providing excellent care. You may receive a survey requesting feedback about your experience at our office. We strive to receive "very good" responses to the survey questions. If you feel that your experience would prevent you from giving the office a "very good " response, please contact our office to try to remedy the situation. We may be reached at [phone]. Thank you for taking the time out of your busy day to complete the survey.  Jacquelyne Balint, MD [facility]

## 2023-06-21 ENCOUNTER — Encounter: Payer: Self-pay | Admitting: Neurology

## 2023-06-26 ENCOUNTER — Other Ambulatory Visit: Payer: Self-pay | Admitting: Neurology

## 2023-06-26 ENCOUNTER — Encounter: Payer: Self-pay | Admitting: Neurology

## 2023-06-26 DIAGNOSIS — E559 Vitamin D deficiency, unspecified: Secondary | ICD-10-CM

## 2023-06-26 LAB — IMMUNOFIXATION ELECTROPHORESIS
IgG (Immunoglobin G), Serum: 1210 mg/dL (ref 600–1640)
IgM, Serum: 222 mg/dL (ref 50–300)
Immunoglobulin A: 506 mg/dL — ABNORMAL HIGH (ref 47–310)

## 2023-06-26 MED ORDER — VITAMIN D (ERGOCALCIFEROL) 1.25 MG (50000 UNIT) PO CAPS: 50000.0000 [IU] | ORAL_CAPSULE | ORAL | 3 refills | Status: DC

## 2023-07-10 ENCOUNTER — Encounter: Payer: Self-pay | Admitting: Neurology

## 2023-07-12 ENCOUNTER — Ambulatory Visit
Admission: RE | Admit: 2023-07-12 | Discharge: 2023-07-12 | Disposition: A | Discharge status: Home / Self Care | Payer: Medicaid Other | Source: Ambulatory Visit | Attending: Neurology | Admitting: Neurology

## 2023-07-12 DIAGNOSIS — R200 Anesthesia of skin: Secondary | ICD-10-CM

## 2023-07-12 DIAGNOSIS — G621 Alcoholic polyneuropathy: Secondary | ICD-10-CM

## 2023-07-12 DIAGNOSIS — M545 Low back pain, unspecified: Secondary | ICD-10-CM

## 2023-07-12 DIAGNOSIS — E519 Thiamine deficiency, unspecified: Secondary | ICD-10-CM

## 2023-07-12 DIAGNOSIS — R2681 Unsteadiness on feet: Secondary | ICD-10-CM

## 2023-07-12 DIAGNOSIS — F1010 Alcohol abuse, uncomplicated: Secondary | ICD-10-CM

## 2023-07-19 ENCOUNTER — Emergency Department (HOSPITAL_BASED_OUTPATIENT_CLINIC_OR_DEPARTMENT_OTHER)
Admit: 2023-07-19 | Discharge: 2023-07-19 | Disposition: A | Discharge status: Home / Self Care | Payer: Medicaid Other | Attending: Emergency Medicine | Admitting: Emergency Medicine

## 2023-07-19 ENCOUNTER — Encounter (HOSPITAL_COMMUNITY): Payer: Self-pay | Admitting: *Deleted

## 2023-07-19 ENCOUNTER — Emergency Department (HOSPITAL_COMMUNITY)
Admission: EM | Admit: 2023-07-19 | Discharge: 2023-07-19 | Disposition: A | Discharge status: Home / Self Care | Payer: Medicaid Other | Attending: Emergency Medicine | Admitting: Emergency Medicine

## 2023-07-19 ENCOUNTER — Other Ambulatory Visit: Payer: Self-pay

## 2023-07-19 DIAGNOSIS — M7989 Other specified soft tissue disorders: Secondary | ICD-10-CM | POA: Insufficient documentation

## 2023-07-19 DIAGNOSIS — Z79899 Other long term (current) drug therapy: Secondary | ICD-10-CM | POA: Insufficient documentation

## 2023-07-19 DIAGNOSIS — Z7901 Long term (current) use of anticoagulants: Secondary | ICD-10-CM | POA: Diagnosis not present

## 2023-07-19 DIAGNOSIS — M79662 Pain in left lower leg: Secondary | ICD-10-CM | POA: Diagnosis not present

## 2023-07-19 DIAGNOSIS — R600 Localized edema: Secondary | ICD-10-CM | POA: Diagnosis not present

## 2023-07-19 DIAGNOSIS — I10 Essential (primary) hypertension: Secondary | ICD-10-CM | POA: Insufficient documentation

## 2023-07-19 NOTE — Discharge Instructions (Signed)
You were seen in the ER for leg swelling.  Your ultrasound did not show any evidence of a blood clot. I recommend trying to elevate your legs, and wearing compression stockings or wrapping your legs to help encourage blood return back to your heart.  Please follow up with your doctor regarding your visit today. Continue to monitor how you're doing and return to the ER for new or worsening symptoms.

## 2023-07-19 NOTE — ED Triage Notes (Signed)
Pt was told by her PCP to come to the ED toi R/O Left leg DVT, has history, pain and swelling in left leg.

## 2023-07-19 NOTE — ED Provider Notes (Signed)
[place] EMERGENCY DEPARTMENT AT [facility] Provider Note   CSN: [id number] Arrival date & time: 07/19/23  0865     History  Chief Complaint  Patient presents with   R/O DVT    Sonya Hurst is a 42 y.o. female with history of migraines, neuropathy, hypertension, prior DVT who presents to the ER complaining of left leg swelling for several days. Went to her PCP, sent to ER to rule out DVT. No CP or SOB. Sits all day at work. Hasn't been able to use compression stockings as they have been too tight with her swelling.   HPI     Home Medications Prior to Admission medications   Medication Sig Start Date End Date Taking? Authorizing Provider  APIXABAN (ELIQUIS) VTE STARTER PACK (10MG  AND 5MG ) Take as directed on package: start with two-5mg  tablets twice daily for 7 days. On day 8, switch to one-5mg  tablet twice daily. Patient taking differently: one-5mg  tablet twice daily. 07/28/22   Ellieana Dolecki T, PA-C  gabapentin (NEURONTIN) 300 MG capsule Take 1 capsule (300 mg total) by mouth daily. 06/20/23   Antony Madura, MD  gabapentin (NEURONTIN) 600 MG tablet Take 600 mg by mouth at bedtime. 09/11/22   [provider]  lisinopril (ZESTRIL) 5 MG tablet Take 1 tablet (5 mg total) by mouth daily. 05/18/22   Zenia Resides, MD  methocarbamol (ROBAXIN) 750 MG tablet Take 750 mg by mouth every 8 (eight) hours as needed. 09/01/22   [provider]  phentermine (ADIPEX-P) 37.5 MG tablet Take 37.5 mg by mouth every morning. 01/10/22   [provider]  thiamine (VITAMIN B1) 100 MG tablet Take 1 tablet (100 mg total) by mouth daily. 06/20/23   Antony Madura, MD  Vitamin D, Ergocalciferol, (DRISDOL) 1.25 MG (50000 UNIT) CAPS capsule Take 1 capsule (50,000 Units total) by mouth every 7 (seven) days. 06/26/23   Antony Madura, MD      Allergies    Pataday [olopatadine hcl]    Review of Systems   Review of Systems  Cardiovascular:  Positive for leg swelling.   Musculoskeletal:  Positive for myalgias.  All other systems reviewed and are negative.   Physical Exam Updated Vital Signs BP 138/87   Pulse 98   Temp 98 F (36.7 C) (Oral)   Resp 18   Ht 5\' 4"  (1.626 m)   Wt (!) 145.2 kg   SpO2 100%   BMI 54.93 kg/m  Physical Exam Vitals and nursing note reviewed.  Constitutional:      Appearance: Normal appearance.  HENT:     Head: Normocephalic and atraumatic.  Eyes:     Conjunctiva/sclera: Conjunctivae normal.  Cardiovascular:     Comments: Nonpitting edema to the left leg Pulmonary:     Effort: Pulmonary effort is normal. No respiratory distress.  Musculoskeletal:     Right lower leg: No edema.     Left lower leg: 1+ Edema present.     Comments: Compartments of the bilateral legs are soft, some tenderness to posterior left knee  Skin:    General: Skin is warm and dry.  Neurological:     Mental Status: She is alert.  Psychiatric:        Mood and Affect: Mood normal.        Behavior: Behavior normal.     ED Results / Procedures / Treatments   Labs (all labs ordered are listed, but only abnormal results are displayed) Labs Reviewed -  No data to display  EKG None  Radiology VAS [place] LOWER EXTREMITY VENOUS (DVT) (ONLY [facility] & [facility])  Result Date: 07/19/2023  Lower Venous DVT Study Patient Name:  AVIELLA DISBROW Winkles  Date of Exam:   07/19/2023 Medical Rec #: [id number]          Accession #:    [id number] Date of Birth: [birth year]           Patient Gender: F Patient Age:   80 years Exam Location:  [facility] Procedure:      VAS [place] LOWER EXTREMITY VENOUS (DVT) Referring Phys: MATTHEW TRIFAN --------------------------------------------------------------------------------  Indications: Pain.  Risk Factors: None identified. Limitations: Poor ultrasound/tissue interface and body habitus. Comparison Study: 07/28/2022 - RIGHT:                   - No evidence of common femoral vein obstruction.                    LEFT:                   -  Findings consistent with acute deep vein thrombosis                   involving the left                   femoral vein, and left popliteal vein.                   - No cystic structure found in the popliteal fossa. Performing Technologist: Chanda Busing RVT  Examination Guidelines: A complete evaluation includes B-mode imaging, spectral Doppler, color Doppler, and power Doppler as needed of all accessible portions of each vessel. Bilateral testing is considered an integral part of a complete examination. Limited examinations for reoccurring indications may be performed as noted. The reflux portion of the exam is performed with the patient in reverse Trendelenburg.  +-----+---------------+---------+-----------+----------+--------------+ RIGHTCompressibilityPhasicitySpontaneityPropertiesThrombus Aging +-----+---------------+---------+-----------+----------+--------------+ CFV  Full           Yes      Yes                                 +-----+---------------+---------+-----------+----------+--------------+   +---------+---------------+---------+-----------+----------+--------------+ LEFT     CompressibilityPhasicitySpontaneityPropertiesThrombus Aging +---------+---------------+---------+-----------+----------+--------------+ CFV      Full           Yes      Yes                                 +---------+---------------+---------+-----------+----------+--------------+ SFJ      Full                                                        +---------+---------------+---------+-----------+----------+--------------+ FV Prox  Full                                                        +---------+---------------+---------+-----------+----------+--------------+ FV Mid   Full                                                        +---------+---------------+---------+-----------+----------+--------------+  FV DistalFull                                                         +---------+---------------+---------+-----------+----------+--------------+ PFV      Full                                                        +---------+---------------+---------+-----------+----------+--------------+ POP      Full           Yes      Yes                                 +---------+---------------+---------+-----------+----------+--------------+ PTV      Full                                                        +---------+---------------+---------+-----------+----------+--------------+ PERO     Full                                                        +---------+---------------+---------+-----------+----------+--------------+ Waveforms detected in the popliteal, posterior tibial, and peroneal arteries are noted to be multiphasic.    Summary: RIGHT: - No evidence of common femoral vein obstruction.  LEFT: - There is no evidence of deep vein thrombosis in the lower extremity. However, portions of this examination were limited- see technologist comments above.  - No cystic structure found in the popliteal fossa.  *See table(s) above for measurements and observations. Electronically signed by Sherald Hess MD on 07/19/2023 at 1:18:25 PM.    Final     Procedures Procedures    Medications Ordered in ED Medications - No data to display  ED Course/ Medical Decision Making/ A&P                                 Medical Decision Making  This patient is a 42 y.o. female  who presents to the ED for concern of left leg swelling.   Differential diagnoses prior to evaluation: The emergent differential diagnosis includes, but is not limited to,  cellulitis, DVT, compartment syndrome . This is not an exhaustive differential.   Past Medical History / Co-morbidities / Social History: migraines, neuropathy, hypertension, prior DVT   Additional history: Chart reviewed. Pertinent results include: Reviewed note from 1 year ago in which I saw patient and diagnosed  her with acute DVT of right leg  Physical Exam: Physical exam performed. The pertinent findings include: Normal vital signs, no acute distress.  Left calf with generalized swelling, nonpitting.  Some tenderness to palpation of the posterior left knee.  Compartments of the lower extremities are soft.  Normal pulses.  Lab Tests/Imaging studies: I personally interpreted labs/imaging and the  pertinent results include: Lower extremity ultrasound shows no evidence of DVT.  Disposition: After consideration of the diagnostic results and the patients response to treatment, I feel that emergency department workup does not suggest an emergent condition requiring admission or immediate intervention beyond what has been performed at this time. The plan is: Discharge to home with PCP follow-up. Recommended elevating her leg and wearing compression stockings. No other symptoms to increase my concern for other causes of swelling such as CHF. No emergent etiology found for symptoms today. The patient is safe for discharge and has been instructed to return immediately for worsening symptoms, change in symptoms or any other concerns.  Final Clinical Impression(s) / ED Diagnoses Final diagnoses:  Left leg swelling    Rx / DC Orders ED Discharge Orders     None      Portions of this report may have been transcribed using voice recognition software. Every effort was made to ensure accuracy; however, inadvertent computerized transcription errors may be present.    Jeanella Flattery 07/19/23 1806    Terald Sleeper, MD 07/21/23 [phone]

## 2023-08-29 ENCOUNTER — Encounter: Payer: Self-pay | Admitting: Neurology

## 2023-08-30 ENCOUNTER — Other Ambulatory Visit: Payer: Self-pay | Admitting: Neurology

## 2023-08-30 DIAGNOSIS — R200 Anesthesia of skin: Secondary | ICD-10-CM

## 2023-08-30 DIAGNOSIS — G621 Alcoholic polyneuropathy: Secondary | ICD-10-CM

## 2023-08-30 MED ORDER — GABAPENTIN 300 MG PO CAPS: ORAL_CAPSULE | ORAL | 11 refills | Status: DC

## 2023-09-06 ENCOUNTER — Other Ambulatory Visit: Payer: Self-pay

## 2023-09-06 ENCOUNTER — Ambulatory Visit
Admission: RE | Admit: 2023-09-06 | Discharge: 2023-09-06 | Disposition: A | Discharge status: Home / Self Care | Payer: Medicaid Other | Source: Ambulatory Visit

## 2023-09-06 VITALS — BP 124/83 | HR 101 | Temp 98.9°F | Resp 18

## 2023-09-06 DIAGNOSIS — L0201 Cutaneous abscess of face: Secondary | ICD-10-CM | POA: Diagnosis not present

## 2023-09-06 MED ORDER — FLUCONAZOLE 150 MG PO TABS: ORAL_TABLET | ORAL | 0 refills | Status: DC

## 2023-09-06 MED ORDER — DOXYCYCLINE HYCLATE 100 MG PO CAPS: 100.0000 mg | ORAL_CAPSULE | Freq: Two times a day (BID) | ORAL | 0 refills | Status: DC

## 2023-09-06 NOTE — ED Triage Notes (Signed)
big knot on face that has been there for 4 days and has turned red and swollen - Entered by patient

## 2023-09-06 NOTE — ED Triage Notes (Signed)
Pt reports bump on right cheek since Monday. She messed with it and now it is draining. Requesting antibiotics

## 2023-09-06 NOTE — ED Provider Notes (Signed)
[facility]    CSN: [id number] Arrival date & time: 09/06/23  1634      History   Chief Complaint Chief Complaint  Patient presents with   Skin Problem   Wound Check    HPI Sonya Hurst is a 42 y.o. female.   Patient here today for ration of swelling to her right cheek that started after she had a small bump in the area and had picked at it and now is draining.  She reports she did try to stick it with a pin as well.  She denies any fever.  She notes swelling has improved somewhat since this morning.    The history is provided by the patient.  Wound Check Pertinent negatives include no shortness of breath.    Past Medical History:  Diagnosis Date   Hypertension    Migraines    Neuropathy    Obesity     Patient Active Problem List   Diagnosis Date Noted   Right sided weakness 01/03/2016   Dysarthria 01/03/2016    Past Surgical History:  Procedure Laterality Date   ESSURE TUBAL LIGATION  2008   FINGER FRACTURE SURGERY      OB History     Gravida  4   Para  4   Term  4   Preterm      AB      Living  4      SAB      IAB      Ectopic      Multiple      Live Births  4            Home Medications    Prior to Admission medications   Medication Sig Start Date End Date Taking? Authorizing Provider  APIXABAN (ELIQUIS) VTE STARTER PACK (10MG  AND 5MG ) Take as directed on package: start with two-5mg  tablets twice daily for 7 days. On day 8, switch to one-5mg  tablet twice daily. Patient taking differently: one-5mg  tablet twice daily. 07/28/22  Yes Roemhildt, Lorin T, PA-C  doxycycline (VIBRAMYCIN) 100 MG capsule Take 1 capsule (100 mg total) by mouth 2 (two) times daily. 09/06/23  Yes Tomi Bamberger, PA-C  fluconazole (DIFLUCAN) 150 MG tablet Take one tab PO today and repeat dose in 3 days if symptoms persist 09/06/23  Yes Tomi Bamberger, PA-C  traZODone (DESYREL) 50 MG tablet Take 50 mg by mouth at bedtime as needed. 08/18/23   Yes [provider]  gabapentin (NEURONTIN) 300 MG capsule Take 1 capsule (300 mg) by mouth during the day and take 2 capsules (600 mg) by mouth in the evening 08/30/23   Antony Madura, MD  lisinopril (ZESTRIL) 5 MG tablet Take 1 tablet (5 mg total) by mouth daily. 05/18/22   Zenia Resides, MD  methocarbamol (ROBAXIN) 750 MG tablet Take 750 mg by mouth every 8 (eight) hours as needed. 09/01/22   [provider]  phentermine (ADIPEX-P) 37.5 MG tablet Take 37.5 mg by mouth every morning. 01/10/22   [provider]  thiamine (VITAMIN B1) 100 MG tablet Take 1 tablet (100 mg total) by mouth daily. 06/20/23   Antony Madura, MD  Vitamin D, Ergocalciferol, (DRISDOL) 1.25 MG (50000 UNIT) CAPS capsule Take 1 capsule (50,000 Units total) by mouth every 7 (seven) days. 06/26/23   Antony Madura, MD    Family History No family history on file.  Social History Social History   Tobacco Use   Smoking  status: Every Day    Current packs/day: 0.25    Average packs/day: 0.3 packs/day for 18.0 years (4.5 ttl pk-yrs)    Types: Cigarettes   Smokeless tobacco: Current   Tobacco comments:    Smoke about 5 per day.  Vaping Use   Vaping status: Never Used  Substance Use Topics   Alcohol use: Yes    Alcohol/week: 0.0 standard drinks of alcohol   Drug use: No     Allergies   Olopatadine hcl   Review of Systems Review of Systems  Constitutional:  Negative for chills and fever.  HENT:  Negative for congestion.   Eyes:  Negative for discharge and redness.  Respiratory:  Negative for shortness of breath.   Gastrointestinal:  Negative for nausea and vomiting.  Skin:  Positive for color change and wound.     Physical Exam Triage Vital Signs ED Triage Vitals  Encounter Vitals Group     BP 09/06/23 1738 124/83     Systolic BP Percentile --      Diastolic BP Percentile --      Pulse Rate 09/06/23 1738 (!) 101     Resp 09/06/23 1738 18     Temp 09/06/23 1738 98.9 F (37.2  C)     Temp Source 09/06/23 1738 Oral     SpO2 09/06/23 1738 100 %     Weight --      Height --      Head Circumference --      Peak Flow --      Pain Score 09/06/23 1739 0     Pain Loc --      Pain Education --      Exclude from Growth Chart --    No data found.  Updated Vital Signs BP 124/83 (BP Location: Left Wrist)   Pulse (!) 101   Temp 98.9 F (37.2 C) (Oral)   Resp 18   SpO2 100%      Physical Exam Vitals and nursing note reviewed.  Constitutional:      General: She is not in acute distress.    Appearance: Normal appearance. She is not ill-appearing.  HENT:     Head: Normocephalic and atraumatic.     Nose: Nose normal. No congestion or rhinorrhea.  Eyes:     Conjunctiva/sclera: Conjunctivae normal.  Cardiovascular:     Rate and Rhythm: Normal rate.  Pulmonary:     Effort: Pulmonary effort is normal. No respiratory distress.  Skin:    Comments: Approximately 4 cm area of erythema and induration noted to right maxillary area without periorbital involvement at this time.  Central wound with minimal purulent drainage  Neurological:     Mental Status: She is alert.  Psychiatric:        Mood and Affect: Mood normal.        Behavior: Behavior normal.      UC Treatments / Results  Labs (all labs ordered are listed, but only abnormal results are displayed) Labs Reviewed - No data to display  EKG   Radiology No results found.  Procedures Procedures (including critical care time)  Medications Ordered in UC Medications - No data to display  Initial Impression / Assessment and Plan / UC Course  I have reviewed the triage vital signs and the nursing notes.  Pertinent labs & imaging results that were available during my care of the patient were reviewed by me and considered in my medical decision making (see chart for details).  Doxycycline prescribed for suspected facial abscess.  Patient request Diflucan as she typically will get yeast infections  after antibiotic therapy.  Diflucan also prescribed.  Advised her to no longer apply pressure or pick at abscess but apply warm compresses to promote drainage and ice to decrease swelling.  Strict instruction to report to the emergency room with any worsening swelling specifically around the eye area or that she should report to the emergency room if she has no improvement with oral antibiotics.  Patient expresses understanding.  Final Clinical Impressions(s) / UC Diagnoses   Final diagnoses:  Facial abscess   Discharge Instructions   None    ED Prescriptions     Medication Sig Dispense Auth. Provider   doxycycline (VIBRAMYCIN) 100 MG capsule Take 1 capsule (100 mg total) by mouth 2 (two) times daily. 20 capsule Erma Pinto F, PA-C   fluconazole (DIFLUCAN) 150 MG tablet Take one tab PO today and repeat dose in 3 days if symptoms persist 2 tablet Tomi Bamberger, PA-C      PDMP not reviewed this encounter.   Tomi Bamberger, PA-C 09/06/23 1811

## 2023-10-31 DIAGNOSIS — I10 Essential (primary) hypertension: Secondary | ICD-10-CM | POA: Diagnosis not present

## 2023-10-31 DIAGNOSIS — F17218 Nicotine dependence, cigarettes, with other nicotine-induced disorders: Secondary | ICD-10-CM | POA: Diagnosis not present

## 2023-10-31 DIAGNOSIS — F1010 Alcohol abuse, uncomplicated: Secondary | ICD-10-CM | POA: Diagnosis not present

## 2023-10-31 DIAGNOSIS — R600 Localized edema: Secondary | ICD-10-CM | POA: Diagnosis not present

## 2023-10-31 DIAGNOSIS — G629 Polyneuropathy, unspecified: Secondary | ICD-10-CM | POA: Diagnosis not present

## 2023-10-31 DIAGNOSIS — Z6841 Body Mass Index (BMI) 40.0 and over, adult: Secondary | ICD-10-CM | POA: Diagnosis not present

## 2023-10-31 DIAGNOSIS — K219 Gastro-esophageal reflux disease without esophagitis: Secondary | ICD-10-CM | POA: Diagnosis not present

## 2023-10-31 DIAGNOSIS — F321 Major depressive disorder, single episode, moderate: Secondary | ICD-10-CM | POA: Diagnosis not present

## 2023-10-31 DIAGNOSIS — R32 Unspecified urinary incontinence: Secondary | ICD-10-CM | POA: Diagnosis not present

## 2023-10-31 DIAGNOSIS — G4700 Insomnia, unspecified: Secondary | ICD-10-CM | POA: Diagnosis not present

## 2023-10-31 DIAGNOSIS — R269 Unspecified abnormalities of gait and mobility: Secondary | ICD-10-CM | POA: Diagnosis not present

## 2023-11-08 ENCOUNTER — Ambulatory Visit: Payer: Medicaid Other | Admitting: Family Medicine

## 2023-11-28 DIAGNOSIS — R142 Eructation: Secondary | ICD-10-CM | POA: Diagnosis not present

## 2023-11-28 DIAGNOSIS — K219 Gastro-esophageal reflux disease without esophagitis: Secondary | ICD-10-CM | POA: Diagnosis not present

## 2023-11-28 DIAGNOSIS — R194 Change in bowel habit: Secondary | ICD-10-CM | POA: Diagnosis not present

## 2023-11-28 DIAGNOSIS — R110 Nausea: Secondary | ICD-10-CM | POA: Diagnosis not present

## 2023-11-28 DIAGNOSIS — Z7901 Long term (current) use of anticoagulants: Secondary | ICD-10-CM | POA: Diagnosis not present

## 2023-12-07 NOTE — Progress Notes (Signed)
 I saw Aithana Kushner Coufal in neurology clinic on 12/20/22 in follow up for neuropathy.  HPI: Sonya Hurst is a 42 y.o. year old right-handed female with a medical history of Sjogren's syndrome, HTN, current smoker, obesity, DVT (on eliquis ), and EtOH abuse who we last saw on 06/20/23.  To briefly review: Initial consult (09/14/22): Symptoms have been present since 05/23/22. She woke up and her legs and hands felt tight with sharp pains in her feet. She numbness was in her legs below the knee and in her hands. She was having trouble walking and was falling. When she stands, she has to make sure she knows where they are. She has had about 6-7 falls since 05/23/22. She does not use an assistive device to help walk.   She endorses being off balance particularly when she closes her eyes or has her eyes covered (taking a shower or putting on a shirt).   She denies any symptoms prior to 05/23/22. Patient was in the hospital the day before. She had a DVT at that visit. She has been seen at the ED and been told she has neuropathy.   Patient is currently on Gabapentin  600 mg TID. She feels like taking the gabapentin  may intensify her symptoms, then her symptoms improve after 40 minutes. She briefly switched gabapentin  to Lyrica, but this made her feet swell, so she was put back on gabapentin . She feels like the numbness and tingling are about the same, but her walking has improved since 05/23/22. She went to physical therapy for the gait imbalance. She stopped about 1 month ago. She did feel like that helped.   Patient has seen rheumatology, Dr. Strazanac. Patient has a positive ANA, SSA, and SSB.   The patient has not had similar episodes of symptoms in the past. She has not had previous episodes of losing vision in one eye. She did have an episode of the left side of her body acutely becoming weak in 2017. It  took about 2 weeks for function to return. She has no residual deficits.   EtOH use: Currently drinks  1.5 bottles of wine per day. 1-2 handles of liquor per day was prior to June Restrictive diet? No Family history of neuropathy/myopathy/NM disease? No   Of note, patient saw Dr. Onita at [facility] neurology in 2017 after her left sided weakness episode. Per patient, she had NCS at that time but was told that was normal (I do not see this in the chart). She was told her left sided symptoms were related to migraine per patient.   12/20/22: Records from rheumatology showed that patient had a positive ANA, SSA, and SSB abs. Symptoms were not clearly rheumatologic though per notes. MRI of brain and cervical spine were normal. Patient did not get labs after last visit.   Patient is walking better. She can stand, but can still get wobbly. She has had no falls. She completed physical therapy. She walks frequently at work.    Patient continues to take gabapentin . She only takes it when she has bad tingling/pain. She took it one time last week. She mentions gabapentin  was making her hair thin and fall out. She also uses lidocaine  cream.   She drinks 3-4 glasses of wine per day currently.  06/20/23: B1 was low (<6). I recommended supplementation with B1 100 mg daily. She is not taking this. Her IFE was indeterminate. Her A1c was 5.7.    Patient is having swelling of her left leg. The  right leg does not swell as much. She is wearing compression stockings. She also mentions that she feels her left toes are dragging. She thinks the numbness and tingling in arms and legs are about the same. She does think she is walking better.   She has also had bowel and bladder loss, about 5 times, but only at night. She will wake up and notice the accident in the bed. She has had some accidents during the day, but when she is trying to get to the bathroom, like she can't get there fast enough. She denies saddle anesthesia. She has only infrequent back pain.   Patient has had one fall since last visit. She got dizzy and fell.    Patient is taking gabapentin  600 mg at bedtime. It makes her sleepy. She feels good in the morning, but has pain during the day. 600 mg makes her too sleepy to take during the day usually. It helps when she takes it.   Patient is drinking 1 bottle of wine per day. She feels like this helps her pain.   Most recent Assessment and Plan (06/20/23): This is TRIS HOWELL, a 42 y.o. 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 She had low B1 (12/2022) which is likely contributing. She also has positive SSA and SSB antibodies, so a Sjogren's sensory neuronopathy could also be contributing. She continues to drink 1 bottle of wine per day and has not started B1 supplementation. I have discussed EMG with patient, but she had a bad experience in the past and is not interested in repeating. Her more concerning symptom is a more focal numbness in the left leg and loss of bowel and bladder, though only when sleeping. I will get an MRI of lumbar spine to evaluate for conus, cauda equina, or radiculopathy.   Plan: -MRI lumbar spine wo contrast -Repeat IFE, vit D -Start B1 (thiamine ) 100 mg daily -Continue home exercises from PT -Discussed continuing to cut down on EtOH as no EtOH would improve nerve healing -Again discussed, but patient not interested in EMG due to prior bad experience with it -Gabapentin  300 mg in afternoon and 600 mg at bedtime  Since their last visit: Labs were significant for low vit D. I recommended 50000 units once a week. She never took this because she was told it was not sent in. Her repeat IFE showed no M protein. She stopped taking B1, thinking she didn't have to keep taking it.  MRI lumbar spine showed no severe stenosis but arthritic/degenerative changes.   She has increased her EtOH use again. She has drank a 1/2 gallon mixed drink. She thinks the numbness and tingling is about the same or better. She  still has imbalance. She may have fallen twice since last visit without injury.  She mentions occasional sore throat, but no other new issues.   MEDICATIONS:  Outpatient Encounter Medications as of 12/21/2023  Medication Sig   APIXABAN  (ELIQUIS ) VTE STARTER PACK (10MG  AND 5MG ) Take as directed on package: start with two-5mg  tablets twice daily for 7 days. On day 8, switch to one-5mg  tablet twice daily. (Patient taking differently: one-5mg  tablet twice daily.)   fluconazole  (DIFLUCAN ) 150 MG tablet Take one tab PO today and repeat dose in 3 days if symptoms persist   furosemide  (LASIX ) 40 MG tablet Take 40 mg by mouth daily.   gabapentin  (NEURONTIN ) 300 MG capsule Take 1 capsule (300 mg) by mouth  during the day and take 2 capsules (600 mg) by mouth in the evening   lisinopril  (ZESTRIL ) 30 MG tablet Take 30 mg by mouth daily.   methocarbamol  (ROBAXIN ) 750 MG tablet Take 750 mg by mouth every 8 (eight) hours as needed.   traZODone (DESYREL) 50 MG tablet Take 50 mg by mouth at bedtime as needed.   WEGOVY 0.25 MG/0.5ML SOAJ Inject 0.25 mg into the skin once a week.   doxycycline  (VIBRAMYCIN ) 100 MG capsule Take 1 capsule (100 mg total) by mouth 2 (two) times daily. (Patient not taking: Reported on 12/21/2023)   lisinopril  (ZESTRIL ) 5 MG tablet Take 1 tablet (5 mg total) by mouth daily. (Patient not taking: Reported on 12/21/2023)   phentermine (ADIPEX-P) 37.5 MG tablet Take 37.5 mg by mouth every morning. (Patient not taking: Reported on 12/21/2023)   thiamine  (VITAMIN B1) 100 MG tablet Take 1 tablet (100 mg total) by mouth daily.   Vitamin D , Ergocalciferol , (DRISDOL ) 1.25 MG (50000 UNIT) CAPS capsule Take 1 capsule (50,000 Units total) by mouth every 7 (seven) days.   [DISCONTINUED] thiamine  (VITAMIN B1) 100 MG tablet Take 1 tablet (100 mg total) by mouth daily. (Patient not taking: Reported on 12/21/2023)   [DISCONTINUED] Vitamin D , Ergocalciferol , (DRISDOL ) 1.25 MG (50000 UNIT) CAPS capsule Take 1 capsule  (50,000 Units total) by mouth every 7 (seven) days. (Patient not taking: Reported on 12/21/2023)   No facility-administered encounter medications on file as of 12/21/2023.    PAST MEDICAL HISTORY: Past Medical History:  Diagnosis Date   Hypertension    Migraines    Neuropathy    Obesity     PAST SURGICAL HISTORY: Past Surgical History:  Procedure Laterality Date   ESSURE TUBAL LIGATION  2008   FINGER FRACTURE SURGERY      ALLERGIES: Allergies  Allergen Reactions   Olopatadine Hcl Swelling    FAMILY HISTORY: History reviewed. No pertinent family history.  SOCIAL HISTORY: Social History   Tobacco Use   Smoking status: Every Day    Current packs/day: 0.25    Average packs/day: 0.3 packs/day for 18.0 years (4.5 ttl pk-yrs)    Types: Cigarettes   Smokeless tobacco: Current   Tobacco comments:    Smoke about 5 per day.  Vaping Use   Vaping status: Never Used  Substance Use Topics   Alcohol use: Yes    Alcohol/week: 0.0 standard drinks of alcohol   Drug use: No   Social History   Social History Narrative   Denies caffeine use.    Objective:  Vital Signs:  BP (!) 148/95   Pulse (!) 104   Ht 5' 4 (1.626 m)   Wt (!) 349 lb (158.3 kg)   SpO2 98%   BMI 59.91 kg/m   General: General appearance: Awake and alert. No distress. Cooperative with exam.  Skin: No obvious rash or jaundice. HEENT: Atraumatic. Anicteric. Lungs: Non-labored breathing on room air  Extremities: No obvious deformity.   Neurological: Mental Status: Alert. Speech fluent. No pseudobulbar affect Cranial Nerves: CNII: No RAPD. Visual fields intact. CNIII, IV, VI: PERRL. No nystagmus. EOMI. CN V: Facial sensation intact bilaterally to fine touch. CN VII: Facial muscles symmetric and strong. No ptosis at rest. CN VIII: Hears finger rub well bilaterally. CN IX: No hypophonia. CN X: Palate elevates symmetrically. CN XI: Full strength shoulder shrug bilaterally. CN XII: Tongue protrusion  full and midline. No atrophy or fasciculations. No significant dysarthria Motor: Tone is normal. Individual muscle group testing (MRC grade  out of 5):  Movement     Neck flexion 5    Neck extension 5     Right Left   Shoulder abduction 5 5   Elbow flexion 5 5   Elbow extension 5 5   Finger abduction - FDI 5 5   Finger abduction - ADM 5 5   Finger extension 5 5   Finger distal flexion - 2/3 5 5    Finger distal flexion - 4/5 5 5    Thumb flexion - FPL 5 5   Thumb abduction - APB 5 5    Hip flexion 5- 5-   Hip extension 5 5   Hip adduction 5 5   Hip abduction 5 5   Knee extension 5 5   Knee flexion 5 5   Dorsiflexion 5 5   Plantarflexion 5 5     Reflexes:  Right Left  Bicep 2+ 2+  Tricep 2+ 2+  BrRad 2+ 2+  Knee 1+ 1+  Ankle 0 0   Sensation: Pinprick: Diminished in lower extremities in length dependent fashion Vibration: Diminished in lower extremities in length dependent fashion Coordination: Intact finger-to- nose-finger and heel-to-shin bilaterally. Romberg negative. Gait: Wide based, unsteady, gait   Lab and Test Review: New results: 06/20/23: Vit D: 7.20 IFE: no M protein  MRI lumbar spine wo contrast (07/12/23): FINDINGS: Segmentation:  Standard.   Alignment:  Physiologic.   Vertebrae:  No fracture, evidence of discitis, or bone lesion.   Conus medullaris and cauda equina: Conus extends to the L1-L2 disc space level. Conus and cauda equina appear normal.   Paraspinal and other soft tissues: There is T2/stir hyperintense signal surrounding the bilateral facet joints at L4-L5 and L5-S1. These findings are most likely degenerative in nature.   Disc levels:   T12-L1: Mild bilateral facet degenerative change. No spinal canal or neural foraminal stenosis.   L1-L2: Mild bilateral facet degenerative change. No spinal canal or neural foraminal stenosis.   L2-L3: Mild bilateral facet degenerative change. No spinal canal or neural foraminal stenosis.    L3-L4: Mild bilateral facet degenerative change. No spinal canal or neural foraminal stenosis.   L4-L5: Severe bilateral facet degenerative change. Ligamentum flavum hypertrophy. No significant disc bulge. There is mild bony spinal canal narrowing. There is epidural lipomatosis that results in moderate narrowing of the thecal sac. Mild right neural foraminal narrowing.   L5-S1: Severe bilateral facet degenerative change. No significant disc bulge. No bony spinal canal narrowing. No neural foraminal narrowing.   IMPRESSION: 1. No definite finding to explain bilateral upper and lower extremity numbness. Given history of chronic alcohol use, if there is concern for subacute combined degeneration / B12 deficiency, further evaluation with a cervical and thoracic spine MRI could be considered. 2. No evidence of high-grade bony spinal canal narrowing. 3. Severe bilateral facet degenerative change at L4-L5 and L5-S1. 4. T2/STIR hyperintense signal surrounding the bilateral facet joints at L4-L5 and L5-S1, most likely degenerative in nature.  Previously reviewed results: 12/20/22: B1 < 6 HbA1c: 5.7 IFE: A poorly-defined area of restricted protein mobility is detected  and is reactive with IgA and lambda antisera.    12/04/22: CBC significant for MCV of 109.2 (chronically elevated)   Normal or unremarkable: TSH BMP significant for mildly elevated glucose LFTs mildly elevated (AST > ALT) CBC significant for MCV of 111.3; platelets 141 B12 (05/18/22): 1464   MRI brain and cervical spine w/wo contrast (10/05/22): FINDINGS: MRI HEAD FINDINGS   Brain: No acute infarction, hemorrhage,  hydrocephalus, extra-axial collection or mass lesion. No white matter disease. No pathologic enhancement.   Vascular: Major arterial voids are maintained at the skull base.   Skull and upper cervical spine: Normal marrow signal.   Sinuses/Orbits: Minimal paranasal sinus mucosal thickening. No  acute orbital findings.   Other: No mastoid effusions.   MRI CERVICAL SPINE FINDINGS   Alignment: Mild reversal the normal cervical lordosis. No substantial sagittal subluxation.   Vertebrae: Vertebral body heights are maintained. No marrow edema to suggest acute fracture or discitis/osteomyelitis. No suspicious bone lesions   Cord: Normal cord signal.  No abnormal cord enhancement.   Posterior Fossa, vertebral arteries, paraspinal tissues: Visualized vertebral artery flow voids are maintained.   Disc levels: Motion limited without evidence of significant disc protrusion, foraminal stenosis, or canal stenosis.   IMPRESSION: 1. Normal brain MRI.  No evidence of acute intracranial abnormality. 2. Unremarkable MRI of the cervical spine. No significant stenosis and normal cord signal.   MRI lumbar spine wo contrast (08/20/22): FINDINGS: Segmentation: Standard. Lowest well-formed disc space labeled the L5-S1 level.   Alignment: Physiologic with preservation of the normal lumbar lordosis. No listhesis.   Vertebrae: Vertebral body height maintained without acute or chronic fracture. Bone marrow signal intensity diffusely decreased on T1 weighted sequence, nonspecific, but most commonly related to anemia, smoking or obesity. No discrete or worrisome osseous lesions. Mild reactive marrow edema present about the L4-5 facets due to facet arthritis. Mild reactive endplate change noted about the T11-12 interspace anteriorly.   Conus medullaris and cauda equina: Conus extends to the L1 level. Conus and cauda equina appear normal.   Paraspinal and other soft tissues: Unremarkable.   Disc levels:   L1-2:  Unremarkable.   L2-3:  Unremarkable.   L3-4: Normal interspace. Mild right greater than left facet hypertrophy with associated trace joint effusions. No spinal stenosis. Foramina remain patent.   L4-5: Normal interspace. Moderate bilateral facet arthrosis with associated  mild reactive marrow edema and trace joint effusions. Mild epidural lipomatosis. No canal or foraminal stenosis.   L5-S1: Normal interspace. Moderate facet hypertrophy. Epidural lipomatosis. No canal or foraminal stenosis.   IMPRESSION: 1. Mild-to-moderate facet hypertrophy at L3-4 through L5-S1, most pronounced at L4-5 where there is associated mild reactive marrow edema. Finding could serve as a source for lower back pain. 2. Otherwise unremarkable MRI of the lumbar spine. No significant disc pathology, stenosis, or evidence for neural impingement.   MRI brain wo contrast (12/30/2015): FINDINGS: Axial diffusion only was performed. When accounting for superficial areas of susceptibility artifact, more prominent that unusual in the setting of multi focal falx ossification, there is no evidence of acute infarct. No hydrocephalus or shift.   IMPRESSION: Diffusion only study that is negative for infarct or other acute finding.  ASSESSMENT: This is KEMARIA DEDIC, a 42 y.o. 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 She had low B1 (12/2022) and vit D which is likely contributing. She also has positive SSA and SSB antibodies, so a Sjogren's sensory neuronopathy could also be contributing. She continues to drink heavy EtOH and has not been taking B1 or vit D supplementation. I have discussed EMG with patient, but she had a bad experience in the past and is not interested in repeating. She has good pain control currently with gabapentin .  Plan: -Lab work: B1, B12, CBC, CMP -Refilled B1 100 mg daily -Resent vit D 50000 international units daily -Discussed continuing to cut  down on EtOH as no EtOH would improve nerve healing  -Continue Gabapentin  300 mg in afternoon and 600 mg at bedtime    Return to clinic in 6 months  Venetia Potters, MD

## 2023-12-21 ENCOUNTER — Other Ambulatory Visit: Payer: Medicaid Other

## 2023-12-21 ENCOUNTER — Ambulatory Visit: Payer: Medicaid Other | Admitting: Neurology

## 2023-12-21 ENCOUNTER — Encounter: Payer: Self-pay | Admitting: Neurology

## 2023-12-21 VITALS — BP 148/95 | HR 104 | Ht 64.0 in | Wt 349.0 lb

## 2023-12-21 DIAGNOSIS — G621 Alcoholic polyneuropathy: Secondary | ICD-10-CM | POA: Diagnosis not present

## 2023-12-21 DIAGNOSIS — E559 Vitamin D deficiency, unspecified: Secondary | ICD-10-CM

## 2023-12-21 DIAGNOSIS — M545 Low back pain, unspecified: Secondary | ICD-10-CM | POA: Diagnosis not present

## 2023-12-21 DIAGNOSIS — E519 Thiamine deficiency, unspecified: Secondary | ICD-10-CM | POA: Diagnosis not present

## 2023-12-21 DIAGNOSIS — R2681 Unsteadiness on feet: Secondary | ICD-10-CM | POA: Diagnosis not present

## 2023-12-21 DIAGNOSIS — F1010 Alcohol abuse, uncomplicated: Secondary | ICD-10-CM

## 2023-12-21 MED ORDER — VITAMIN D (ERGOCALCIFEROL) 1.25 MG (50000 UNIT) PO CAPS: 50000.0000 [IU] | ORAL_CAPSULE | ORAL | 3 refills | Status: DC

## 2023-12-21 MED ORDER — THIAMINE HCL 100 MG PO TABS: 100.0000 mg | ORAL_TABLET | Freq: Every day | ORAL | 11 refills | Status: AC

## 2023-12-21 NOTE — Patient Instructions (Addendum)
-  Lab work today. I will be in touch when I have the results. -Refilled B1 100 mg daily -Resent vit D 50000 international units daily  Call me if you have problems getting either of these vitamins.  -Discussed continuing to cut down on alcohol as no alcohol would improve nerve healing  -Gabapentin  300 mg in afternoon and 600 mg at bedtime   Follow up in 6 months.   Please let me know if you have any questions or concerns in the meantime.   The physicians and staff at [facility] are committed to providing excellent care. You may receive a survey requesting feedback about your experience at our office. We strive to receive very good responses to the survey questions. If you feel that your experience would prevent you from giving the office a very good  response, please contact our office to try to remedy the situation. We may be reached at [phone]. Thank you for taking the time out of your busy day to complete the survey.  Venetia Potters, MD [facility]

## 2023-12-26 LAB — COMPREHENSIVE METABOLIC PANEL
AG Ratio: 1.2 (calc) (ref 1.0–2.5)
ALT: 26 U/L (ref 6–29)
AST: 55 U/L — ABNORMAL HIGH (ref 10–30)
Albumin: 3.5 g/dL — ABNORMAL LOW (ref 3.6–5.1)
Alkaline phosphatase (APISO): 173 U/L — ABNORMAL HIGH (ref 31–125)
BUN/Creatinine Ratio: 6 (calc) (ref 6–22)
BUN: 5 mg/dL — ABNORMAL LOW (ref 7–25)
CO2: 29 mmol/L (ref 20–32)
Calcium: 8.3 mg/dL — ABNORMAL LOW (ref 8.6–10.2)
Chloride: 104 mmol/L (ref 98–110)
Creat: 0.83 mg/dL (ref 0.50–0.99)
Globulin: 3 g/dL (ref 1.9–3.7)
Glucose, Bld: 93 mg/dL (ref 65–99)
Potassium: 3.5 mmol/L (ref 3.5–5.3)
Sodium: 143 mmol/L (ref 135–146)
Total Bilirubin: 0.3 mg/dL (ref 0.2–1.2)
Total Protein: 6.5 g/dL (ref 6.1–8.1)

## 2023-12-26 LAB — CBC
HCT: 35.4 % (ref 35.0–45.0)
Hemoglobin: 11.9 g/dL (ref 11.7–15.5)
MCH: 38.6 pg — ABNORMAL HIGH (ref 27.0–33.0)
MCHC: 33.6 g/dL (ref 32.0–36.0)
MCV: 114.9 fL — ABNORMAL HIGH (ref 80.0–100.0)
MPV: 12.1 fL (ref 7.5–12.5)
Platelets: 140 10*3/uL (ref 140–400)
RBC: 3.08 10*6/uL — ABNORMAL LOW (ref 3.80–5.10)
RDW: 15 % (ref 11.0–15.0)
WBC: 6.5 10*3/uL (ref 3.8–10.8)

## 2023-12-26 LAB — VITAMIN B12: Vitamin B-12: 464 pg/mL (ref 200–1100)

## 2023-12-26 LAB — VITAMIN B1: Vitamin B1 (Thiamine): <6 nmol/L — ABNORMAL LOW (ref 8–30)

## 2024-01-21 ENCOUNTER — Telehealth: Payer: Self-pay | Admitting: General Practice

## 2024-01-21 ENCOUNTER — Ambulatory Visit: Payer: Medicaid Other | Admitting: Family Medicine

## 2024-01-21 NOTE — Telephone Encounter (Signed)
Called patient to reschedule missed new patient appointment no answer left voicemail

## 2024-01-28 ENCOUNTER — Encounter: Payer: Self-pay | Admitting: Neurology

## 2024-01-28 ENCOUNTER — Telehealth: Payer: Self-pay

## 2024-01-28 NOTE — Telephone Encounter (Signed)
 Called and talked to pt and she reported that her prescription is different than in Chart, she said Dr. Genita Keys called in new script in the office when she was here.

## 2024-01-30 ENCOUNTER — Telehealth: Payer: Self-pay

## 2024-01-30 ENCOUNTER — Telehealth: Payer: Self-pay | Admitting: Neurology

## 2024-01-30 DIAGNOSIS — G621 Alcoholic polyneuropathy: Secondary | ICD-10-CM

## 2024-01-30 DIAGNOSIS — R200 Anesthesia of skin: Secondary | ICD-10-CM

## 2024-01-30 MED ORDER — GABAPENTIN 300 MG PO CAPS: ORAL_CAPSULE | ORAL | 11 refills | Status: DC

## 2024-01-30 NOTE — Telephone Encounter (Signed)
Called patient back about gabapentin and confusion around dosing. She states that we discussed increasing her gabapentin to 300 mg in the morning, 300 mg midday, and 600 mg at night. I did not remember this, but after discussion am okay with that increase.  I will send a new prescription to her pharmacy. She will let me know if there is any difficulties getting her medication.  All questions were answered.  Jacquelyne Balint, MD [facility]

## 2024-02-08 NOTE — Telephone Encounter (Signed)
 Marland Kitchen

## 2024-02-13 DIAGNOSIS — D510 Vitamin B12 deficiency anemia due to intrinsic factor deficiency: Secondary | ICD-10-CM | POA: Diagnosis not present

## 2024-02-14 ENCOUNTER — Other Ambulatory Visit: Payer: Self-pay | Admitting: Gastroenterology

## 2024-02-18 ENCOUNTER — Encounter (HOSPITAL_COMMUNITY): Payer: Self-pay | Admitting: Gastroenterology

## 2024-02-20 DIAGNOSIS — E876 Hypokalemia: Secondary | ICD-10-CM | POA: Diagnosis not present

## 2024-02-22 NOTE — Anesthesia Preprocedure Evaluation (Signed)
 Anesthesia Evaluation    Reviewed: Allergy & Precautions, Patient's Chart, lab work & pertinent test results  Airway        Dental   Pulmonary Current Smoker          Cardiovascular hypertension, Pt. on medications      Neuro/Psych  Headaches  negative psych ROS   GI/Hepatic Neg liver ROS,GERD  ,,  Endo/Other    Class 3 obesity  Renal/GU negative Renal ROS  negative genitourinary   Musculoskeletal negative musculoskeletal ROS (+)    Abdominal   Peds  Hematology negative hematology ROS (+)   Anesthesia Other Findings   Reproductive/Obstetrics negative OB ROS S/p TL                             Anesthesia Physical Anesthesia Plan  ASA: 3  Anesthesia Plan: MAC   Post-op Pain Management:    Induction:   PONV Risk Score and Plan: 2 and Propofol infusion and TIVA  Airway Management Planned: Natural Airway and Simple Face Mask  Additional Equipment: None  Intra-op Plan:   Post-operative Plan:   Informed Consent:   Plan Discussed with:   Anesthesia Plan Comments:        Anesthesia Quick Evaluation

## 2024-02-25 ENCOUNTER — Ambulatory Visit (HOSPITAL_COMMUNITY): Admission: RE | Admit: 2024-02-25 | Payer: 59 | Source: Home / Self Care | Admitting: Gastroenterology

## 2024-02-25 ENCOUNTER — Encounter (HOSPITAL_COMMUNITY): Payer: Self-pay | Admitting: Anesthesiology

## 2024-02-25 SURGERY — COLONOSCOPY WITH PROPOFOL
Anesthesia: Monitor Anesthesia Care

## 2024-03-04 DIAGNOSIS — R768 Other specified abnormal immunological findings in serum: Secondary | ICD-10-CM | POA: Diagnosis not present

## 2024-03-04 DIAGNOSIS — Z6841 Body Mass Index (BMI) 40.0 and over, adult: Secondary | ICD-10-CM | POA: Diagnosis not present

## 2024-03-04 DIAGNOSIS — M79642 Pain in left hand: Secondary | ICD-10-CM | POA: Diagnosis not present

## 2024-03-04 DIAGNOSIS — R202 Paresthesia of skin: Secondary | ICD-10-CM | POA: Diagnosis not present

## 2024-03-04 DIAGNOSIS — M79641 Pain in right hand: Secondary | ICD-10-CM | POA: Diagnosis not present

## 2024-03-04 DIAGNOSIS — B370 Candidal stomatitis: Secondary | ICD-10-CM | POA: Diagnosis not present

## 2024-03-04 DIAGNOSIS — R682 Dry mouth, unspecified: Secondary | ICD-10-CM | POA: Diagnosis not present

## 2024-03-18 DIAGNOSIS — R682 Dry mouth, unspecified: Secondary | ICD-10-CM | POA: Diagnosis not present

## 2024-03-18 DIAGNOSIS — Z6841 Body Mass Index (BMI) 40.0 and over, adult: Secondary | ICD-10-CM | POA: Diagnosis not present

## 2024-03-18 DIAGNOSIS — R202 Paresthesia of skin: Secondary | ICD-10-CM | POA: Diagnosis not present

## 2024-03-18 DIAGNOSIS — B370 Candidal stomatitis: Secondary | ICD-10-CM | POA: Diagnosis not present

## 2024-03-18 DIAGNOSIS — M79642 Pain in left hand: Secondary | ICD-10-CM | POA: Diagnosis not present

## 2024-03-18 DIAGNOSIS — R768 Other specified abnormal immunological findings in serum: Secondary | ICD-10-CM | POA: Diagnosis not present

## 2024-03-18 DIAGNOSIS — M79641 Pain in right hand: Secondary | ICD-10-CM | POA: Diagnosis not present

## 2024-04-09 ENCOUNTER — Encounter (HOSPITAL_COMMUNITY): Payer: Self-pay | Admitting: Gastroenterology

## 2024-04-09 NOTE — Progress Notes (Signed)
 Attempted to obtain medical history for pre op call via telephone, unable to reach at this time. HIPAA compliant voicemail message left requesting return call to pre surgical testing department.

## 2024-04-11 ENCOUNTER — Other Ambulatory Visit: Payer: Self-pay | Admitting: Neurology

## 2024-04-11 DIAGNOSIS — R200 Anesthesia of skin: Secondary | ICD-10-CM

## 2024-04-11 DIAGNOSIS — G621 Alcoholic polyneuropathy: Secondary | ICD-10-CM

## 2024-04-11 MED ORDER — GABAPENTIN 300 MG PO CAPS: ORAL_CAPSULE | ORAL | 11 refills | Status: DC

## 2024-04-15 ENCOUNTER — Emergency Department (HOSPITAL_COMMUNITY)
Admission: EM | Admit: 2024-04-15 | Discharge: 2024-04-15 | Disposition: A | Discharge status: Home / Self Care | Attending: Emergency Medicine | Admitting: Emergency Medicine

## 2024-04-15 ENCOUNTER — Encounter (HOSPITAL_COMMUNITY): Payer: Self-pay

## 2024-04-15 ENCOUNTER — Emergency Department (HOSPITAL_COMMUNITY)

## 2024-04-15 ENCOUNTER — Other Ambulatory Visit: Payer: Self-pay

## 2024-04-15 DIAGNOSIS — K7689 Other specified diseases of liver: Secondary | ICD-10-CM | POA: Diagnosis not present

## 2024-04-15 DIAGNOSIS — K429 Umbilical hernia without obstruction or gangrene: Secondary | ICD-10-CM | POA: Diagnosis not present

## 2024-04-15 DIAGNOSIS — E876 Hypokalemia: Secondary | ICD-10-CM | POA: Insufficient documentation

## 2024-04-15 DIAGNOSIS — R000 Tachycardia, unspecified: Secondary | ICD-10-CM | POA: Diagnosis not present

## 2024-04-15 DIAGNOSIS — K828 Other specified diseases of gallbladder: Secondary | ICD-10-CM | POA: Diagnosis not present

## 2024-04-15 DIAGNOSIS — R748 Abnormal levels of other serum enzymes: Secondary | ICD-10-CM | POA: Insufficient documentation

## 2024-04-15 DIAGNOSIS — R1084 Generalized abdominal pain: Secondary | ICD-10-CM | POA: Insufficient documentation

## 2024-04-15 DIAGNOSIS — R197 Diarrhea, unspecified: Secondary | ICD-10-CM | POA: Insufficient documentation

## 2024-04-15 DIAGNOSIS — K760 Fatty (change of) liver, not elsewhere classified: Secondary | ICD-10-CM | POA: Diagnosis not present

## 2024-04-15 DIAGNOSIS — Z743 Need for continuous supervision: Secondary | ICD-10-CM | POA: Diagnosis not present

## 2024-04-15 DIAGNOSIS — R1013 Epigastric pain: Secondary | ICD-10-CM | POA: Diagnosis not present

## 2024-04-15 DIAGNOSIS — R112 Nausea with vomiting, unspecified: Secondary | ICD-10-CM | POA: Insufficient documentation

## 2024-04-15 DIAGNOSIS — R1010 Upper abdominal pain, unspecified: Secondary | ICD-10-CM | POA: Diagnosis not present

## 2024-04-15 DIAGNOSIS — K8020 Calculus of gallbladder without cholecystitis without obstruction: Secondary | ICD-10-CM | POA: Diagnosis not present

## 2024-04-15 DIAGNOSIS — R109 Unspecified abdominal pain: Secondary | ICD-10-CM | POA: Diagnosis not present

## 2024-04-15 DIAGNOSIS — I10 Essential (primary) hypertension: Secondary | ICD-10-CM | POA: Diagnosis not present

## 2024-04-15 LAB — COMPREHENSIVE METABOLIC PANEL WITH GFR
ALT: 31 U/L (ref 0–44)
AST: 85 U/L — ABNORMAL HIGH (ref 15–41)
Albumin: 2.8 g/dL — ABNORMAL LOW (ref 3.5–5.0)
Alkaline Phosphatase: 164 U/L — ABNORMAL HIGH (ref 38–126)
Anion gap: 9 (ref 5–15)
BUN: <5 mg/dL — ABNORMAL LOW (ref 6–20)
CO2: 28 mmol/L (ref 22–32)
Calcium: 8.2 mg/dL — ABNORMAL LOW (ref 8.9–10.3)
Chloride: 102 mmol/L (ref 98–111)
Creatinine, Ser: 0.58 mg/dL (ref 0.44–1.00)
GFR, Estimated: >60 mL/min (ref >60)
Glucose, Bld: 106 mg/dL — ABNORMAL HIGH (ref 70–99)
Potassium: 2.6 mmol/L — CL (ref 3.5–5.1)
Sodium: 139 mmol/L (ref 135–145)
Total Bilirubin: 1.1 mg/dL (ref 0.0–1.2)
Total Protein: 6.7 g/dL (ref 6.5–8.1)

## 2024-04-15 LAB — LIPASE, BLOOD: Lipase: 24 U/L (ref 11–51)

## 2024-04-15 LAB — CBC
HCT: 38.2 % (ref 36.0–46.0)
Hemoglobin: 12.9 g/dL (ref 12.0–15.0)
MCH: 38.9 pg — ABNORMAL HIGH (ref 26.0–34.0)
MCHC: 33.8 g/dL (ref 30.0–36.0)
MCV: 115.1 fL — ABNORMAL HIGH (ref 80.0–100.0)
Platelets: 134 10*3/uL — ABNORMAL LOW (ref 150–400)
RBC: 3.32 MIL/uL — ABNORMAL LOW (ref 3.87–5.11)
RDW: 16.5 % — ABNORMAL HIGH (ref 11.5–15.5)
WBC: 7 10*3/uL (ref 4.0–10.5)
nRBC: 0 % (ref 0.0–0.2)

## 2024-04-15 LAB — HCG, SERUM, QUALITATIVE: Preg, Serum: NEGATIVE

## 2024-04-15 LAB — MAGNESIUM: Magnesium: 2 mg/dL (ref 1.7–2.4)

## 2024-04-15 MED ORDER — SODIUM CHLORIDE (PF) 0.9 % IJ SOLN
INTRAMUSCULAR | Status: AC
  Filled 2024-04-15: qty 50

## 2024-04-15 MED ORDER — POTASSIUM CHLORIDE CRYS ER 20 MEQ PO TBCR
20.0000 meq | EXTENDED_RELEASE_TABLET | Freq: Once | ORAL | Status: AC
  Administered 2024-04-15: 20 meq via ORAL
  Filled 2024-04-15: qty 1

## 2024-04-15 MED ORDER — POTASSIUM CHLORIDE CRYS ER 20 MEQ PO TBCR
60.0000 meq | EXTENDED_RELEASE_TABLET | Freq: Once | ORAL | Status: AC
  Administered 2024-04-15: 60 meq via ORAL
  Filled 2024-04-15: qty 3

## 2024-04-15 MED ORDER — IOHEXOL 300 MG/ML  SOLN
100.0000 mL | Freq: Once | INTRAMUSCULAR | Status: AC | PRN
  Administered 2024-04-15: 100 mL via INTRAVENOUS

## 2024-04-15 MED ORDER — ONDANSETRON HCL 4 MG PO TABS: 4.0000 mg | ORAL_TABLET | Freq: Four times a day (QID) | ORAL | 0 refills | Status: AC

## 2024-04-15 MED ORDER — LACTATED RINGERS IV BOLUS
1000.0000 mL | Freq: Once | INTRAVENOUS | Status: AC
  Administered 2024-04-15: 1000 mL via INTRAVENOUS

## 2024-04-15 MED ORDER — ONDANSETRON HCL 4 MG/2ML IJ SOLN
4.0000 mg | Freq: Once | INTRAMUSCULAR | Status: AC
  Administered 2024-04-15: 4 mg via INTRAVENOUS
  Filled 2024-04-15: qty 2

## 2024-04-15 NOTE — ED Triage Notes (Signed)
 Patient arrived via [facility] for epigastric pain, N/V/D for 1 week. Patient was supposed to have EGD done today, but stated they canceled it due to her vomiting. Patient states she still has her gallbladder.

## 2024-04-15 NOTE — ED Provider Notes (Signed)
 [place] EMERGENCY DEPARTMENT AT [facility] Provider Note   CSN: [id number] Arrival date & time: 04/15/24  8295     History Chief Complaint  Patient presents with   Abdominal Pain     Abdominal Pain Associated symptoms: diarrhea, fatigue, nausea and vomiting   Associated symptoms: no chills, no constipation and no fever    Sonya Hurst is a 43 y.o. female presenting for persistent upper abdominal pain along with nausea and vomiting of the last week.  She was scheduled to go in for EGD and colonoscopy however was canceled due to her persistent nausea.  Colonoscopy was for screening purposes however the EGD was scheduled alongside due to concern for potential biliary disease.  She was referred to our ED for management of her persistent nausea and vomiting as she has not tolerated any oral intake over the last several days she states that even water causes her to be nauseated.  She denies any hematemesis.  She has multiple episodes of loose stools, denying any frank hematochezia but is stating that her stools are darker but not tarry in appearance more goldish in color.  She states that GI was concerned for gallbladder disease as she had persistent belching and other gastric symptoms that led them to believe she may have gallstones.   Patient's recorded medical, surgical, social, medication list and allergies were reviewed in the Snapshot window as part of the initial history.   Review of Systems   Review of Systems  Constitutional:  Positive for appetite change and fatigue. Negative for chills and fever.       Loss of appetite over the last week  Gastrointestinal:  Positive for abdominal pain, diarrhea, nausea and vomiting. Negative for abdominal distention, blood in stool, constipation and rectal pain.  Neurological:  Negative for dizziness and light-headedness.    Physical Exam Updated Vital Signs BP 126/80   Pulse 81   Temp 97.9 F (36.6 C) (Oral)   Resp 18    Ht 5\' 4"  (1.626 m)   Wt 133.4 kg   LMP  (LMP Unknown)   SpO2 100%   BMI 50.46 kg/m  Physical Exam Vitals and nursing note reviewed.  Constitutional:      General: She is not in acute distress.    Appearance: Normal appearance.  HENT:     Head: Normocephalic and atraumatic.     Mouth/Throat:     Mouth: Mucous membranes are moist.     Pharynx: Oropharynx is clear.  Eyes:     General: No scleral icterus.    Extraocular Movements: Extraocular movements intact.     Conjunctiva/sclera: Conjunctivae normal.     Pupils: Pupils are equal, round, and reactive to light.  Cardiovascular:     Rate and Rhythm: Normal rate and regular rhythm.     Pulses: Normal pulses.     Heart sounds: Normal heart sounds. No murmur heard.    No friction rub. No gallop.  Pulmonary:     Effort: Pulmonary effort is normal.     Breath sounds: Normal breath sounds.  Abdominal:     General: Abdomen is flat. Bowel sounds are normal. There is no distension.     Palpations: Abdomen is soft. There is no mass.     Tenderness: There is generalized abdominal tenderness. There is no guarding or rebound. Positive signs include Murphy's sign.  Musculoskeletal:        General: Normal range of motion.     Cervical back: Normal range  of motion and neck supple.     Right lower leg: No edema.     Left lower leg: No edema.  Skin:    General: Skin is warm and dry.     Capillary Refill: Capillary refill takes less than 2 seconds.  Neurological:     General: No focal deficit present.     Mental Status: She is alert. Mental status is at baseline.  Psychiatric:        Mood and Affect: Mood normal.      ED Course/ Medical Decision Making/ A&P Clinical Course as of 04/15/24 1459  Tue Apr 15, 2024  1128 Potassium(!!): 2.6 Begin oral replacement with 20 mEq.  Noted that she takes oral solution regularly for potassium replenishment.  From outside records. [JG]  1129 Alkaline Phosphatase(!): 164 Suggestive of cholestatic  pattern, will follow-up with abdominal ultrasound as well as CT imaging of the abdomen [JG]    Clinical Course User Index [JG] Juanetta Nordmann, PA    Procedures Procedures   Medications Ordered in ED Medications  potassium chloride  SA (KLOR-CON  M) CR tablet 60 mEq (has no administration in time range)  ondansetron (ZOFRAN) injection 4 mg (4 mg Intravenous Given 04/15/24 1214)  potassium chloride  SA (KLOR-CON  M) CR tablet 20 mEq (20 mEq Oral Given 04/15/24 1119)  lactated ringers bolus 1,000 mL (1,000 mLs Intravenous New Bag/Given 04/15/24 1325)  iohexol  (OMNIPAQUE ) 300 MG/ML solution 100 mL (100 mLs Intravenous Contrast Given 04/15/24 1248)    Medical Decision Making:   Sonya Hurst is a 43 y.o. female who presented to the ED today with nausea and vomiting along with upper abdominal pain detailed above.    Patient placed on continuous vitals and telemetry monitoring while in ED which was reviewed periodically.  Complete initial physical exam performed, notably the patient  was diffusely tender in her abdomen particularly with palpation of the right upper quadrant with a positive Murphy sign elicited.  There is no conjunctival pallor, no scleral icterus.     Reviewed and confirmed nursing documentation for past medical history, family history, social history.    Initial Assessment:   With the patient's presentation of upper abdominal pain along with nausea and vomiting with, most likely diagnosis is cholelithiasis/cholecystitis. Other diagnoses were considered including (but not limited to) ascending cholangitis, pancreatitis. These are considered less likely due to history of present illness and physical exam findings.     Initial Plan:  Provide IV Zofran to manage acute nausea. Screening labs including CBC and Metabolic panel to evaluate for infectious or metabolic etiology of disease.  Assess serum lipase to assess for possible pancreatic etiology Urinalysis with reflex  culture ordered to evaluate for UTI or relevant urologic/nephrologic pathology.  Obtain stool guaiac to evaluate GI bleed EKG to evaluate for cardiac pathology Objective evaluation as below reviewed   Initial Study Results:   Laboratory  All laboratory results reviewed without evidence of clinically relevant pathology.   Exceptions include: Hypokalemia noted with reading of 2.6.  EKG EKG was reviewed independently. Rate, rhythm, axis, intervals all examined and without medically relevant abnormality. ST segments without concerns for elevations.    Radiology:  All images reviewed independently. Agree with radiology report at this time.   CT ABDOMEN PELVIS W CONTRAST Result Date: 04/15/2024 CLINICAL DATA:  Acute abdominal pain. Abnormal liver on recent ultrasound. EXAM: CT ABDOMEN AND PELVIS WITH CONTRAST TECHNIQUE: Multidetector CT imaging of the abdomen and pelvis was performed using the standard protocol following bolus  administration of intravenous contrast. RADIATION DOSE REDUCTION: This exam was performed according to the departmental dose-optimization program which includes automated exposure control, adjustment of the mA and/or kV according to patient size and/or use of iterative reconstruction technique. CONTRAST:  OMNIPAQUE  IOHEXOL  300 MG/ML  SOLN COMPARISON:  Ultrasound of 04/15/2024, and CT on 01/03/2019 FINDINGS: Lower Chest: No acute findings. Hepatobiliary: Moderate diffuse hepatic steatosis is noted. Enlarged caudate lobe and mild capsular nodularity in the right hepatic lobe raise suspicion for cirrhosis. No hepatic masses are identified. Gallbladder is unremarkable. No evidence of biliary ductal dilatation. Pancreas:  No mass or inflammatory changes. Spleen: Within normal limits in size and appearance. Adrenals/Urinary Tract: No suspicious masses identified. No evidence of ureteral calculi or hydronephrosis. Stomach/Bowel: No evidence of obstruction, inflammatory process or  abnormal fluid collections. Fatty infiltration of submucosa noted in the right colon, without signs of colitis or other significant abnormality. Vascular/Lymphatic: No pathologically enlarged lymph nodes. No acute vascular findings. Reproductive: No mass or other significant abnormality. Essure micro-inserts are again seen. Other:  Stable small fat-containing umbilical hernia. Musculoskeletal:  No suspicious bone lesions identified. IMPRESSION: Moderate hepatic steatosis. Findings suspicious for early cirrhosis. Recommend correlation with liver function tests and hepatic serology. Stable small fat-containing umbilical hernia. Electronically Signed   By: Marlyce Sine M.D.   On: 04/15/2024 13:53   US  Abdomen Limited RUQ (LIVER/GB) Result Date: 04/15/2024 CLINICAL DATA:  Upper abdominal pain EXAM: ULTRASOUND ABDOMEN LIMITED RIGHT UPPER QUADRANT COMPARISON:  Ultrasound 11/09/2021. FINDINGS: Gallbladder: Distended gallbladder. Some layering stones. Also some non dependent lobular areas under 5 mm consistent with small polyps. No wall thickening or adjacent fluid. Common bile duct: Diameter: 4 mm Liver: Diffusely echogenic hepatic parenchyma consistent with fatty liver infiltration. With this level of echogenicity evaluation for underlying mass lesion is limited and if needed follow-up contrast CT or MRI as clinically appropriate. Portal vein is patent on color Doppler imaging with normal direction of blood flow towards the liver. Other: None. IMPRESSION: Gallstones. Gallbladder polyp. No ductal dilatation. No further sonographic evidence of acute cholecystitis. Fatty liver infiltration. Electronically Signed   By: Adrianna Horde M.D.   On: 04/15/2024 12:15     Reassessment and Plan:   Managed hypokalemia with oral potassium as noted.   Imaging does not demonstrate any acute cholecystitis or pancreatic inflammation.  Given her stable condition and lack of emergent findings on imaging, we will discharge this  patient with outpatient follow-up for general surgery related to cholelithiasis.  Further will refer to general practice for monitoring of her hypokalemia.  Will further provide p.o. Zofran as needed for nausea.  Clinical Impression: No diagnosis found.   Data Unavailable   Final Clinical Impression(s) / ED Diagnoses Final diagnoses:  None    Rx / DC Orders ED Discharge Orders     None         Juanetta Nordmann, PA 04/15/24 1501    Deatra Face, MD 04/15/24 1519

## 2024-04-15 NOTE — Discharge Instructions (Addendum)
 As we discussed, please call the surgical referral that has been provided to you for an appointment within the next day.  Further instructions have been provided to you regarding a bland diet which you should follow for the near future to reduce abdominal pain.  Should your abdominal pain worsen or you start to have increased nausea and vomiting despite medications as been prescribed, please return to this emergency room for further evaluation.

## 2024-04-26 NOTE — Progress Notes (Unsigned)
 Cardiology Office Note:    Date:  04/29/2024   ID:  Sonya Hurst, DOB 1981/08/22, MRN [id number]  PCP:  Brunetta Capes, PA   [facility] Providers Cardiologist:  None     Referring MD: Annabell Key, [place]  E, PA   Chief Complaint  Patient presents with   Shortness of Breath    History of Present Illness:    Sonya Hurst is a 43 y.o. female is seen at the request of [place]  Annabell Key PA for evaluation of dyspnea on exertion. She has a history of morbid obesity, tobacco and Etoh abuse, HTN, and prior DVT- left femoral and popliteal in August 2023. She has neuropathy. Activity is very limited. Still smokes 4 cigs/day and drinks 1-2 beers a day. Has lost 22 lbs this year. Notes occasional swelling in her left leg which is where she had the DVT. On chronic Eliquis . Recently in ED for evaluation of abdominal pain and found to have gallstones. May need surgery. She denies any chest pain or palpitations. No prior pulmonary evaluation or evaluation for sleep apnea.   Past Medical History:  Diagnosis Date   Alcohol abuse    BMI 50.0-59.9, adult (HCC)    Cigarette nicotine dependence with other nicotine-induced disorder    Depression    Exertional dyspnea    Gait disorder    GERD (gastroesophageal reflux disease)    History of DVT (deep vein thrombosis)    Hypertension    Insomnia    Migraines    Morbid obesity (HCC)    Multiple falls    Neuropathy    Neuropathy    Obesity    Urinary incontinence     Past Surgical History:  Procedure Laterality Date   ESSURE TUBAL LIGATION  2008   FINGER FRACTURE SURGERY      Current Medications: Current Meds  Medication Sig   APIXABAN  (ELIQUIS ) VTE STARTER PACK (10MG  AND 5MG ) Take as directed on package: start with two-5mg  tablets twice daily for 7 days. On day 8, switch to one-5mg  tablet twice daily. (Patient taking differently: one-5mg  tablet twice daily.)   cetirizine (ZYRTEC) 10 MG tablet Take 10 mg by mouth daily.    fluconazole  (DIFLUCAN ) 150 MG tablet Take one tab PO today and repeat dose in 3 days if symptoms persist   fluticasone (FLONASE) 50 MCG/ACT nasal spray Place 2 sprays into both nostrils daily.   furosemide (LASIX) 40 MG tablet Take 40 mg by mouth daily.   gabapentin  (NEURONTIN ) 300 MG capsule Take 1 capsule (300 mg) by mouth in the morning and take 2 capsules (600 mg) by mouth in the evening   lisinopril  (ZESTRIL ) 30 MG tablet Take 30 mg by mouth daily.   methocarbamol (ROBAXIN) 750 MG tablet Take 750 mg by mouth every 8 (eight) hours as needed.   ondansetron  (ZOFRAN ) 4 MG tablet Take 1 tablet (4 mg total) by mouth every 6 (six) hours.   Potassium Chloride  40 MEQ/15ML (20%) SOLN daily.   thiamine  (VITAMIN B1) 100 MG tablet Take 1 tablet (100 mg total) by mouth daily.   traZODone (DESYREL) 100 MG tablet Take 100 mg by mouth at bedtime as needed.   Vitamin D , Ergocalciferol , (DRISDOL ) 1.25 MG (50000 UNIT) CAPS capsule Take 1 capsule (50,000 Units total) by mouth every 7 (seven) days.   WEGOVY 0.25 MG/0.5ML SOAJ Inject 0.25 mg into the skin once a week.   [DISCONTINUED] traZODone (DESYREL) 50 MG tablet Take 50 mg by mouth at bedtime as needed.  Allergies:   Olopatadine hcl   Social History   Socioeconomic History   Marital status: Single    Spouse name: Not on file   Number of children: Not on file   Years of education: Not on file   Highest education level: Not on file  Occupational History   Not on file  Tobacco Use   Smoking status: Every Day    Current packs/day: 0.25    Average packs/day: 0.3 packs/day for 18.0 years (4.5 ttl pk-yrs)    Types: Cigarettes   Smokeless tobacco: Current   Tobacco comments:    Smoke about 5 per day.  Vaping Use   Vaping status: Never Used  Substance and Sexual Activity   Alcohol use: Yes    Alcohol/week: 0.0 standard drinks of alcohol   Drug use: No   Sexual activity: Yes    Birth control/protection: Implant  Other Topics Concern   Not on  file  Social History Narrative   Denies caffeine use.   Social Drivers of Corporate investment banker Strain: Not on file  Food Insecurity: Not on file  Transportation Needs: Not on file  Physical Activity: Not on file  Stress: Not on file  Social Connections: Unknown (04/21/2022)   Received from [facility]   Social Network    Social Network: Not on file     Family History: The patient's family history includes Sleep apnea in her father.  ROS:   Please see the history of present illness.     All other systems reviewed and are negative.  EKGs/Labs/Other Studies Reviewed:    The following studies were reviewed today: EKG Interpretation Date/Time:  Tuesday Apr 29 2024 09:56:25 EDT Ventricular Rate:  99 PR Interval:  118 QRS Duration:  82 QT Interval:  382 QTC Calculation: 490 R Axis:   44  Text Interpretation: Normal sinus rhythm Low voltage QRS Cannot rule out Anterior infarct , age undetermined When compared with ECG of 04-Dec-2022 11:16, Nonspecific T wave abnormality now evident in Anterior leads Confirmed by [place], Ellieana Dolecki [id number]) on 04/29/2024 9:57:45 AM     Recent Labs: 04/15/2024: ALT 31; BUN <5; Creatinine, Ser 0.58; Hemoglobin 12.9; Magnesium 2.0; Platelets 134; Potassium 2.6; Sodium 139  Recent Lipid Panel No results found for: "CHOL", "TRIG", "HDL", "CHOLHDL", "VLDL", "LDLCALC", "LDLDIRECT"   Risk Assessment/Calculations:                Physical Exam:    VS:  BP 126/70 (BP Location: Left Wrist, Patient Position: Sitting, Cuff Size: Normal)   Pulse 99   Ht 5\' 5"  (1.651 m)   Wt (!) 327 lb 9.6 oz (148.6 kg)   LMP  (LMP Unknown)   SpO2 98%   BMI 54.52 kg/m     Wt Readings from Last 3 Encounters:  04/29/24 (!) 327 lb 9.6 oz (148.6 kg)  04/15/24 294 lb (133.4 kg)  12/21/23 (!) 349 lb (158.3 kg)     GEN:  Well nourished, obese in no acute distress HEENT: Normal NECK: No JVD; No carotid bruits LYMPHATICS: No  lymphadenopathy CARDIAC: RRR, no murmurs, rubs, gallops RESPIRATORY:  Clear to auscultation without rales, wheezing or rhonchi  ABDOMEN: Soft, non-tender, non-distended MUSCULOSKELETAL:  No edema; No deformity  SKIN: Warm and dry NEUROLOGIC:  Alert and oriented x 3 PSYCHIATRIC:  Normal affect   ASSESSMENT:    1. Exertional dyspnea   2. History of DVT (deep vein thrombosis)   3. Morbid obesity (HCC)   4.  Tobacco abuse    PLAN:    In order of problems listed above:  Patient has chronic DOE. Multiple factors at play including ongoing tobacco abuse, history of DVT, morbid obesity. No overt evidence of CHF. Will obtain Echo to assess cardiac function. If normal then would consider pulmonary evaluation and possible sleep study. No symptoms of angina. Recommend smoking cessation. Encourage aerobic activity.            Medication Adjustments/Labs and Tests Ordered: Current medicines are reviewed at length with the patient today.  Concerns regarding medicines are outlined above.  Orders Placed This Encounter  Procedures   EKG 12-Lead   ECHOCARDIOGRAM COMPLETE   No orders of the defined types were placed in this encounter.   Patient Instructions  Medication Instructions:  Continue same medications  Lab Work: None ordered  Testing/Procedures: Echo  first available  Follow-Up: At [facility], you and your health needs are our priority.  As part of our continuing mission to provide you with exceptional heart care, our providers are all part of one team.  This team includes your primary Cardiologist (physician) and Advanced Practice Providers or APPs (Physician Assistants and Nurse Practitioners) who all work together to provide you with the care you need, when you need it.  Your next appointment:  To Be Determined after test    Provider:  Dr.Morad Tal    We recommend signing up for the patient portal called "MyChart".  Sign up information is provided on this After  Visit Summary.  MyChart is used to connect with patients for Virtual Visits (Telemedicine).  Patients are able to view lab/test results, encounter notes, upcoming appointments, etc.  Non-urgent messages can be sent to your provider as well.   To learn more about what you can do with MyChart, go to [email or web address].         Signed, Letty Salvi [place], MD  04/29/2024 10:12 AM    [facility]

## 2024-04-29 ENCOUNTER — Ambulatory Visit: Attending: Cardiology | Admitting: Cardiology

## 2024-04-29 ENCOUNTER — Encounter: Payer: Self-pay | Admitting: Cardiology

## 2024-04-29 VITALS — BP 126/70 | HR 99 | Ht 65.0 in | Wt 327.6 lb

## 2024-04-29 DIAGNOSIS — Z86718 Personal history of other venous thrombosis and embolism: Secondary | ICD-10-CM

## 2024-04-29 DIAGNOSIS — R0609 Other forms of dyspnea: Secondary | ICD-10-CM

## 2024-04-29 DIAGNOSIS — Z720 Tobacco use: Secondary | ICD-10-CM | POA: Diagnosis not present

## 2024-04-29 NOTE — Patient Instructions (Signed)
 Medication Instructions:  Continue same medications  Lab Work: None ordered  Testing/Procedures: Echo  first available  Follow-Up: At [facility], you and your health needs are our priority.  As part of our continuing mission to provide you with exceptional heart care, our providers are all part of one team.  This team includes your primary Cardiologist (physician) and Advanced Practice Providers or APPs (Physician Assistants and Nurse Practitioners) who all work together to provide you with the care you need, when you need it.  Your next appointment:  To Be Determined after test    Provider:  Dr.Jordan    We recommend signing up for the patient portal called "MyChart".  Sign up information is provided on this After Visit Summary.  MyChart is used to connect with patients for Virtual Visits (Telemedicine).  Patients are able to view lab/test results, encounter notes, upcoming appointments, etc.  Non-urgent messages can be sent to your provider as well.   To learn more about what you can do with MyChart, go to [email or web address].

## 2024-05-02 ENCOUNTER — Ambulatory Visit: Payer: Self-pay | Admitting: Surgery

## 2024-05-02 DIAGNOSIS — R1011 Right upper quadrant pain: Secondary | ICD-10-CM | POA: Diagnosis not present

## 2024-05-02 DIAGNOSIS — K8050 Calculus of bile duct without cholangitis or cholecystitis without obstruction: Secondary | ICD-10-CM | POA: Diagnosis not present

## 2024-05-02 NOTE — H&P (Signed)
 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  She was seen in the emergency department 2 weeks ago with abdominal pain associated with diarrhea, nausea, vomiting and fatigue.  The pain is in the upper abdomen and had been going on for about a week at that time.  She had an EGD and colonoscopy pending (colonoscopy screening, EGD due to concern of biliary disease) but these were canceled due to persistent nausea and vomiting.  Colonoscopy now scheduled for 6/17.  Ultimately symptoms were controlled and she was discharged with outpatient follow-up.   She ports ongoing nausea and postprandial emesis.  The right upper quadrant pain did fade after a few days following the initial episode.  Struggles with ongoing constipation and states that her stools are very golden in color.   Ultrasound 04/15/2024: Distended gallbladder with layering stones and small polyps, common bile duct 4 mm, no sonographic evidence of cholecystitis.  Fatty liver infiltration.  CT abdomen pelvis 04/15/24: Hepatic steatosis with an enlarged caudate lobe and mild capsular nodularity in the right hepatic lobe raising suspicion for cirrhosis, but no hepatic masses.  Unremarkable gallbladder, fatty infiltration of submucosa in the right colon without evidence of colitis, small fat-containing umbilical hernia.  Labs 04/15/24: Hypokalemia at 2.6, alk phos 164 (was 173 in January), albumin 2.8, AST 85, ALT 31, total bilirubin 1.1; white count 7, hemoglobin 12.9, platelets 134  She works at a [employer]   Review of Systems: A complete review of systems was obtained from the patient.  I have reviewed this information and discussed as appropriate with the patient.  See HPI as well for other ROS.   Medical History: Past Medical History:  Diagnosis Date   DVT (deep venous thrombosis) (CMS/HHS-HCC)    GERD (gastroesophageal reflux disease)    Hyperlipidemia    Hypertension     There is no problem list on file for this patient.   Past Surgical History:  Procedure Laterality Date   LAPAROSCOPIC TUBAL LIGATION  2008     Allergies  Allergen Reactions   Olopatadine Swelling    Current Outpatient Medications on File Prior to Visit  Medication Sig Dispense Refill   apixaban  5 mg (74 tabs) DsPk Take as directed on package: start with two-5mg  tablets twice daily for 7 days. On day 8, switch to one-5mg  tablet twice daily.     bisacodyL (DULCOLAX, BISACODYL,) 5 mg EC tablet Take 2 tablets at 12 noon and 2 tablets at 2 pm Orally One day for 1 days     buPROPion (WELLBUTRIN SR) 150 MG SR tablet twice a day for 90 days     cetirizine (ZYRTEC) 10 MG tablet Take 10 mg by mouth once daily     disulfiram (ANTABUSE) 250 mg tablet 1 tablet Orally Once a day     ergocalciferol , vitamin D2, 1,250 mcg (50,000 unit) capsule Take 50,000 Units by mouth every 7 (seven) days     fluconazole  (DIFLUCAN ) 150 MG tablet Take one tab PO today and repeat dose in 3 days if symptoms persist     fluticasone propionate (FLONASE) 50 mcg/actuation nasal spray  Place 2 sprays into one nostril once daily     folic acid (FOLVITE) 1 MG tablet 1 tablet Orally Once a day for 90 days     FUROsemide (LASIX) 40 MG tablet Take 40 mg by mouth once daily     gabapentin  (NEURONTIN ) 300 MG capsule Take 1 capsule (300 mg) by mouth in the morning and take 2 capsules (600 mg) by mouth in the evening     lisinopriL  (ZESTRIL ) 20 MG tablet Take 20 mg by mouth once daily     methocarbamoL (ROBAXIN) 750 MG tablet Take 750  mg by mouth     metoprolol TARTrate (LOPRESSOR) 25 MG tablet TAKE 1 TABLET BY MOUTH TWICE A DAY WITH FOOD FOR 30 DAYS for 90     nystatin (MYCOSTATIN) 100,000 unit/mL suspension TAKE 4ML BY MOUTH /THROAT 4 TIMES A DAY FOR 10 DAYS     omeprazole (PRILOSEC) 40 MG DR capsule 1 CAP BY MOUTH DAILY, TAKE 20-30 MINUTES BEFORE BREAKFAST     ondansetron  (ZOFRAN ) 4 MG tablet Take 4 mg by mouth     peg-electrolyte (NULYTELY) solution ML Orally once for 1 days     potassium chloride  40 mEq/15 mL oral solution once daily     thiamine  (VITAMIN B-1) 100 MG tablet Take 100 mg by mouth once daily     traZODone (DESYREL) 100 MG tablet Take 100 mg by mouth     WEGOVY 0.5 mg/0.5 mL pen injector INJECT 0.5 ML SUBCUTANEOUSLY ONE TIME PER WEEK     No current facility-administered medications on file prior to visit.    Family History  Problem Relation Age of Onset   Obesity Mother    Obesity Father    High blood pressure (Hypertension) Father    Hyperlipidemia (Elevated cholesterol) Father      Social History   Tobacco Use  Smoking Status Unknown  Smokeless Tobacco Not on file     Social History   Socioeconomic History   Marital status: Single  Tobacco Use   Smoking status: Unknown  Substance and Sexual Activity   Alcohol use: Yes   Drug use: Defer   Social Drivers of Health    Received from [employer]   Social Network    Objective:    Vitals:   05/02/24 1010 05/02/24 1011  BP: (!) 141/86   Pulse: (!) 118   Temp: 36.5 C (97.7 F)   SpO2: 99%   Weight: (!) 148 kg (326 lb 3.2 oz)   Height: 162.6 cm (5\' 4" )   PainSc:    4  PainLoc:  Abdomen    Body mass index is 55.99 kg/m.  Gen: A&Ox3, no distress  Chest: respiratory effort is normal. Abdomen: soft, nondistended, nontender.  Neuro: no gross deficit Psych: appropriate mood and affect, normal insight/judgment intact  Skin: warm and dry    Assessment and Plan:  Diagnoses and all orders for this visit:  Right upper  quadrant abdominal pain  Biliary colic    I recommend proceeding with laparoscopic cholecystectomy with possible cholangiogram.  Discussed the relevant anatomy using a diagram to demonstrate, and went over surgical technique.  Discussed risks of surgery including bleeding, infection, pain, scarring, intraabdominal injury specifically to the common bile duct and sequelae, subtotal cholecystectomy, bile leak, conversion to open surgery, failure to resolve symptoms, post-cholecystectomy diarrhea which is typically self-limited, blood clots/ pulmonary embolus, heart attack, pneumonia, stroke, death.  Discussed that I am not sure how this will impact her current issue with  constipation.  We also reviewed the CT findings of fat infiltration along the right colon and discussed that the colonoscopy will be helpful in sorting this out.  She does have a fat-containing umbilical hernia, will likely not address this at the time of gallbladder surgery as it asymptomatic and prohibitive risk of recurrence given habitus. Questions were welcomed and answered to patient's satisfaction.  Patient wishes to proceed with surgery.    Aldon Hung MD FACS

## 2024-05-05 DIAGNOSIS — R399 Unspecified symptoms and signs involving the genitourinary system: Secondary | ICD-10-CM | POA: Diagnosis not present

## 2024-05-05 DIAGNOSIS — R112 Nausea with vomiting, unspecified: Secondary | ICD-10-CM | POA: Diagnosis not present

## 2024-05-07 DIAGNOSIS — E876 Hypokalemia: Secondary | ICD-10-CM | POA: Diagnosis not present

## 2024-05-08 ENCOUNTER — Emergency Department (HOSPITAL_COMMUNITY)

## 2024-05-08 ENCOUNTER — Observation Stay (HOSPITAL_COMMUNITY)
Admission: EM | Admit: 2024-05-08 | Discharge: 2024-05-10 | Disposition: A | Discharge status: Home / Self Care | Attending: Family Medicine | Admitting: Family Medicine

## 2024-05-08 ENCOUNTER — Other Ambulatory Visit: Payer: Self-pay

## 2024-05-08 ENCOUNTER — Encounter (HOSPITAL_COMMUNITY): Payer: Self-pay

## 2024-05-08 DIAGNOSIS — K8012 Calculus of gallbladder with acute and chronic cholecystitis without obstruction: Secondary | ICD-10-CM | POA: Insufficient documentation

## 2024-05-08 DIAGNOSIS — K420 Umbilical hernia with obstruction, without gangrene: Secondary | ICD-10-CM | POA: Diagnosis not present

## 2024-05-08 DIAGNOSIS — I10 Essential (primary) hypertension: Secondary | ICD-10-CM | POA: Diagnosis not present

## 2024-05-08 DIAGNOSIS — R1110 Vomiting, unspecified: Secondary | ICD-10-CM

## 2024-05-08 DIAGNOSIS — F17210 Nicotine dependence, cigarettes, uncomplicated: Secondary | ICD-10-CM | POA: Insufficient documentation

## 2024-05-08 DIAGNOSIS — F1010 Alcohol abuse, uncomplicated: Secondary | ICD-10-CM | POA: Diagnosis not present

## 2024-05-08 DIAGNOSIS — Z7901 Long term (current) use of anticoagulants: Secondary | ICD-10-CM | POA: Insufficient documentation

## 2024-05-08 DIAGNOSIS — R7401 Elevation of levels of liver transaminase levels: Secondary | ICD-10-CM | POA: Diagnosis not present

## 2024-05-08 DIAGNOSIS — Z86718 Personal history of other venous thrombosis and embolism: Secondary | ICD-10-CM | POA: Diagnosis not present

## 2024-05-08 DIAGNOSIS — K8020 Calculus of gallbladder without cholecystitis without obstruction: Principal | ICD-10-CM

## 2024-05-08 DIAGNOSIS — Z79899 Other long term (current) drug therapy: Secondary | ICD-10-CM | POA: Insufficient documentation

## 2024-05-08 DIAGNOSIS — E876 Hypokalemia: Secondary | ICD-10-CM | POA: Diagnosis not present

## 2024-05-08 DIAGNOSIS — K7581 Nonalcoholic steatohepatitis (NASH): Secondary | ICD-10-CM | POA: Insufficient documentation

## 2024-05-08 DIAGNOSIS — R1011 Right upper quadrant pain: Secondary | ICD-10-CM | POA: Diagnosis not present

## 2024-05-08 LAB — CBC
HCT: 35.7 % — ABNORMAL LOW (ref 36.0–46.0)
Hemoglobin: 12.2 g/dL (ref 12.0–15.0)
MCH: 39.1 pg — ABNORMAL HIGH (ref 26.0–34.0)
MCHC: 34.2 g/dL (ref 30.0–36.0)
MCV: 114.4 fL — ABNORMAL HIGH (ref 80.0–100.0)
Platelets: 142 10*3/uL — ABNORMAL LOW (ref 150–400)
RBC: 3.12 MIL/uL — ABNORMAL LOW (ref 3.87–5.11)
RDW: 15.6 % — ABNORMAL HIGH (ref 11.5–15.5)
WBC: 8.1 10*3/uL (ref 4.0–10.5)
nRBC: 0 % (ref 0.0–0.2)

## 2024-05-08 LAB — COMPREHENSIVE METABOLIC PANEL WITH GFR
ALT: 35 U/L (ref 0–44)
AST: 97 U/L — ABNORMAL HIGH (ref 15–41)
Albumin: 2.6 g/dL — ABNORMAL LOW (ref 3.5–5.0)
Alkaline Phosphatase: 158 U/L — ABNORMAL HIGH (ref 38–126)
Anion gap: 9 (ref 5–15)
BUN: 5 mg/dL — ABNORMAL LOW (ref 6–20)
CO2: 30 mmol/L (ref 22–32)
Calcium: 8.1 mg/dL — ABNORMAL LOW (ref 8.9–10.3)
Chloride: 102 mmol/L (ref 98–111)
Creatinine, Ser: 0.62 mg/dL (ref 0.44–1.00)
GFR, Estimated: >60 mL/min (ref >60)
Glucose, Bld: 112 mg/dL — ABNORMAL HIGH (ref 70–99)
Potassium: 2.8 mmol/L — ABNORMAL LOW (ref 3.5–5.1)
Sodium: 141 mmol/L (ref 135–145)
Total Bilirubin: 0.6 mg/dL (ref 0.0–1.2)
Total Protein: 6.6 g/dL (ref 6.5–8.1)

## 2024-05-08 LAB — URINALYSIS, ROUTINE W REFLEX MICROSCOPIC
Glucose, UA: NEGATIVE mg/dL
Ketones, ur: NEGATIVE mg/dL
Nitrite: NEGATIVE
Protein, ur: 100 mg/dL — AB
RBC / HPF: >50 RBC/hpf (ref 0–5)
Specific Gravity, Urine: 1.023 (ref 1.005–1.030)
pH: 6 (ref 5.0–8.0)

## 2024-05-08 LAB — HCG, SERUM, QUALITATIVE: Preg, Serum: NEGATIVE

## 2024-05-08 LAB — LIPASE, BLOOD: Lipase: 25 U/L (ref 11–51)

## 2024-05-08 LAB — MAGNESIUM: Magnesium: 1.7 mg/dL (ref 1.7–2.4)

## 2024-05-08 MED ORDER — ACETAMINOPHEN 325 MG PO TABS: 650.0000 mg | ORAL_TABLET | Freq: Four times a day (QID) | ORAL | Status: DC | PRN

## 2024-05-08 MED ORDER — POTASSIUM CHLORIDE 10 MEQ/100ML IV SOLN
10.0000 meq | INTRAVENOUS | Status: DC
  Filled 2024-05-08: qty 100

## 2024-05-08 MED ORDER — MORPHINE SULFATE (PF) 4 MG/ML IV SOLN
4.0000 mg | Freq: Once | INTRAVENOUS | Status: AC
  Administered 2024-05-08: 4 mg via INTRAVENOUS
  Filled 2024-05-08: qty 1

## 2024-05-08 MED ORDER — LORAZEPAM 1 MG PO TABS: 1.0000 mg | ORAL_TABLET | ORAL | Status: DC | PRN

## 2024-05-08 MED ORDER — ONDANSETRON HCL 4 MG/2ML IJ SOLN: 4.0000 mg | Freq: Four times a day (QID) | INTRAMUSCULAR | Status: DC | PRN

## 2024-05-08 MED ORDER — GABAPENTIN 300 MG PO CAPS
300.0000 mg | ORAL_CAPSULE | Freq: Two times a day (BID) | ORAL | Status: DC
  Administered 2024-05-08 – 2024-05-10 (×4): 300 mg via ORAL
  Filled 2024-05-08 (×4): qty 1

## 2024-05-08 MED ORDER — METHOCARBAMOL 500 MG PO TABS
750.0000 mg | ORAL_TABLET | Freq: Three times a day (TID) | ORAL | Status: DC | PRN
  Administered 2024-05-08: 750 mg via ORAL
  Filled 2024-05-08: qty 2

## 2024-05-08 MED ORDER — ONDANSETRON HCL 4 MG/2ML IJ SOLN
4.0000 mg | Freq: Once | INTRAMUSCULAR | Status: AC
  Administered 2024-05-08: 4 mg via INTRAVENOUS
  Filled 2024-05-08: qty 2

## 2024-05-08 MED ORDER — THIAMINE MONONITRATE 100 MG PO TABS
100.0000 mg | ORAL_TABLET | Freq: Every day | ORAL | Status: DC
  Administered 2024-05-08 – 2024-05-10 (×3): 100 mg via ORAL
  Filled 2024-05-08 (×3): qty 1

## 2024-05-08 MED ORDER — ENOXAPARIN SODIUM 80 MG/0.8ML IJ SOSY
75.0000 mg | PREFILLED_SYRINGE | INTRAMUSCULAR | Status: DC
  Filled 2024-05-08: qty 0.8

## 2024-05-08 MED ORDER — LACTATED RINGERS IV BOLUS
1000.0000 mL | Freq: Once | INTRAVENOUS | Status: AC
  Administered 2024-05-08: 1000 mL via INTRAVENOUS

## 2024-05-08 MED ORDER — THIAMINE HCL 100 MG/ML IJ SOLN
100.0000 mg | Freq: Every day | INTRAMUSCULAR | Status: DC
  Filled 2024-05-08: qty 2

## 2024-05-08 MED ORDER — FUROSEMIDE 40 MG PO TABS
40.0000 mg | ORAL_TABLET | Freq: Every day | ORAL | Status: DC
  Filled 2024-05-08: qty 1

## 2024-05-08 MED ORDER — HYDROMORPHONE HCL 1 MG/ML IJ SOLN: 0.5000 mg | INTRAMUSCULAR | Status: DC | PRN

## 2024-05-08 MED ORDER — OXYCODONE HCL 5 MG PO TABS: 5.0000 mg | ORAL_TABLET | ORAL | Status: DC | PRN

## 2024-05-08 MED ORDER — FLUTICASONE PROPIONATE 50 MCG/ACT NA SUSP
2.0000 | Freq: Every day | NASAL | Status: DC
  Administered 2024-05-08 – 2024-05-10 (×3): 2 via NASAL
  Filled 2024-05-08: qty 16

## 2024-05-08 MED ORDER — FOLIC ACID 1 MG PO TABS
1.0000 mg | ORAL_TABLET | Freq: Every day | ORAL | Status: DC
  Administered 2024-05-08 – 2024-05-10 (×3): 1 mg via ORAL
  Filled 2024-05-08 (×3): qty 1

## 2024-05-08 MED ORDER — ONDANSETRON HCL 4 MG PO TABS
4.0000 mg | ORAL_TABLET | Freq: Four times a day (QID) | ORAL | Status: DC | PRN
  Administered 2024-05-09: 4 mg via ORAL
  Filled 2024-05-08: qty 1

## 2024-05-08 MED ORDER — LORATADINE 10 MG PO TABS
10.0000 mg | ORAL_TABLET | Freq: Every day | ORAL | Status: DC
  Administered 2024-05-08 – 2024-05-10 (×2): 10 mg via ORAL
  Filled 2024-05-08 (×3): qty 1

## 2024-05-08 MED ORDER — POTASSIUM CHLORIDE CRYS ER 20 MEQ PO TBCR
40.0000 meq | EXTENDED_RELEASE_TABLET | Freq: Once | ORAL | Status: AC
  Administered 2024-05-08: 40 meq via ORAL

## 2024-05-08 MED ORDER — ADULT MULTIVITAMIN W/MINERALS CH
1.0000 | ORAL_TABLET | Freq: Every day | ORAL | Status: DC
  Administered 2024-05-08 – 2024-05-10 (×3): 1 via ORAL
  Filled 2024-05-08 (×3): qty 1

## 2024-05-08 MED ORDER — POTASSIUM CHLORIDE 20 MEQ PO PACK
40.0000 meq | PACK | Freq: Every day | ORAL | Status: DC
Start: 2024-05-09 — End: 2024-05-10
  Administered 2024-05-09 – 2024-05-10 (×2): 40 meq via ORAL
  Filled 2024-05-08 (×2): qty 2

## 2024-05-08 MED ORDER — LISINOPRIL 20 MG PO TABS
30.0000 mg | ORAL_TABLET | Freq: Every day | ORAL | Status: DC
  Administered 2024-05-09 – 2024-05-10 (×2): 30 mg via ORAL
  Filled 2024-05-08: qty 1
  Filled 2024-05-08: qty 3

## 2024-05-08 MED ORDER — PANTOPRAZOLE SODIUM 40 MG PO TBEC
40.0000 mg | DELAYED_RELEASE_TABLET | Freq: Every day | ORAL | Status: DC
  Administered 2024-05-08 – 2024-05-10 (×3): 40 mg via ORAL
  Filled 2024-05-08 (×3): qty 1

## 2024-05-08 MED ORDER — POTASSIUM CHLORIDE CRYS ER 20 MEQ PO TBCR
40.0000 meq | EXTENDED_RELEASE_TABLET | Freq: Once | ORAL | Status: AC
  Administered 2024-05-08: 40 meq via ORAL
  Filled 2024-05-08: qty 2

## 2024-05-08 MED ORDER — ACETAMINOPHEN 650 MG RE SUPP: 650.0000 mg | Freq: Four times a day (QID) | RECTAL | Status: DC | PRN

## 2024-05-08 MED ORDER — ALBUTEROL SULFATE (2.5 MG/3ML) 0.083% IN NEBU: 2.5000 mg | INHALATION_SOLUTION | RESPIRATORY_TRACT | Status: DC | PRN

## 2024-05-08 MED ORDER — ENOXAPARIN SODIUM 40 MG/0.4ML IJ SOSY: 40.0000 mg | PREFILLED_SYRINGE | INTRAMUSCULAR | Status: DC

## 2024-05-08 MED ORDER — SODIUM CHLORIDE 0.9 % IV SOLN
12.5000 mg | Freq: Once | INTRAVENOUS | Status: AC
  Administered 2024-05-08: 12.5 mg via INTRAVENOUS
  Filled 2024-05-08: qty 12.5

## 2024-05-08 NOTE — H&P (Signed)
 History and Physical  Sonya Hurst WUJ:[id number] DOB: June 23, 1981 DOA: 05/08/2024  PCP: Annabell Key, [place]  E, PA   Chief Complaint: Abdominal pain, vomiting  HPI: Sonya Hurst is a 43 y.o. 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  This issue started for her back in April, with intermittent abdominal pain and vomiting, she is actually seen in the surgical clinic 05/02/2024 with plan to proceed with outpatient laparoscopic cholecystectomy possible cholangiogram.  When symptoms started back in April, she was told to discontinue her [facility] however this has not helped her symptoms.  Regarding the topical anticoagulation, patient has had only 1 unprovoked DVT and has been on Eliquis  since that time in 2023.  States that her PCP has kept her on this medication because she was taking birth control, however plan was for her to stop taking Eliquis  in the next month or so since she is no longer on birth control.  On further discussion, patient says that while she is compliant with her other medications, she often forgets to take her Eliquis  and has not taken it since Monday 5/19.  ER provider discussed with on-call general surgery, they request hospitalist admission and will see the patient in the morning.  Review of Systems: Please see HPI for pertinent positives and negatives. A complete 10 system review of systems are otherwise negative.  Past Medical History:  Diagnosis Date   Alcohol abuse    BMI 50.0-59.9, adult (HCC)    Cigarette nicotine dependence with other nicotine-induced disorder    Depression    Exertional dyspnea    Gait disorder    GERD (gastroesophageal reflux disease)    History of DVT (deep vein thrombosis)    Hypertension    Insomnia    Migraines    Morbid obesity  (HCC)    Multiple falls    Neuropathy    Neuropathy    Obesity    Urinary incontinence    Past Surgical History:  Procedure Laterality Date   ESSURE TUBAL LIGATION  2008   FINGER FRACTURE SURGERY     Social History:  reports that she has been smoking cigarettes. She has a 4.5 pack-year smoking history. She uses smokeless tobacco. She reports current alcohol use. She reports that she does not use drugs.  Allergies  Allergen Reactions   Olopatadine Hcl Swelling    Ophthalmic swelling    Family History  Problem Relation Age of Onset   Sleep apnea Father      Prior to Admission medications   Medication Sig Start Date End Date Taking? Authorizing Provider  APIXABAN  (ELIQUIS ) VTE STARTER PACK (10MG  AND 5MG ) Take as directed on package: start with two-5mg  tablets twice daily for 7 days. On day 8, switch to one-5mg  tablet twice daily. Patient taking differently: one-5mg  tablet twice daily. 07/28/22   Roemhildt, Lorin T, PA-C  cetirizine (ZYRTEC) 10 MG tablet Take 10 mg by mouth daily.    [provider]  fluconazole  (DIFLUCAN ) 150 MG tablet Take one tab PO today and repeat dose in 3 days if symptoms persist 09/06/23   Vernestine Gondola, PA-C  fluticasone [facility]) 50 MCG/ACT nasal spray Place 2 sprays into both nostrils daily.    [provider]  furosemide (LASIX) 40 MG tablet Take 40 mg by mouth daily. 11/25/23   [provider]  gabapentin  (NEURONTIN ) 300 MG capsule Take 1 capsule (300 mg) by mouth in the morning and take 2 capsules (600 mg) by mouth in the evening 04/11/24   Ellene Gustin, MD  lisinopril  (ZESTRIL ) 30 MG tablet Take 30 mg by mouth daily. 11/27/23   [provider]  methocarbamol (ROBAXIN) 750 MG tablet Take 750 mg by mouth every 8 (eight) hours as needed. 09/01/22   [provider]  ondansetron  (ZOFRAN ) 4 MG tablet Take 1 tablet (4 mg total) by mouth every 6 (six) hours. 04/15/24   Juanetta Nordmann, PA  Potassium Chloride  40  MEQ/15ML (20%) SOLN daily. 03/04/24   [provider]  thiamine  (VITAMIN B1) 100 MG tablet Take 1 tablet (100 mg total) by mouth daily. 12/21/23   Ellene Gustin, MD  traZODone (DESYREL) 100 MG tablet Take 100 mg by mouth at bedtime as needed. 02/13/24   [provider]  Vitamin D , Ergocalciferol , (DRISDOL ) 1.25 MG (50000 UNIT) CAPS capsule Take 1 capsule (50,000 Units total) by mouth every 7 (seven) days. 12/21/23   Ellene Gustin, MD  WEGOVY 0.25 MG/0.5ML SOAJ Inject 0.25 mg into the skin once a week. 12/17/23   [provider]    Physical Exam: BP 111/65 (BP Location: Left Wrist)   Pulse 94   Temp 97.9 F (36.6 C) (Oral)   Resp 20   LMP  (LMP Unknown)   SpO2 95%  General:  Alert, oriented, calm, in no acute distress, morbidly obese Cardiovascular: RRR, no murmurs or rubs, no peripheral edema  Respiratory: clear to auscultation bilaterally, no wheezes, no crackles  Abdomen: soft, right upper quadrant is tender with some voluntary guarding, nondistended, normal bowel tones heard  Skin: dry, no rashes  Musculoskeletal: no joint effusions, normal range of motion  Psychiatric: appropriate affect, normal speech  Neurologic: extraocular muscles intact, clear speech, moving all extremities with intact sensorium         Labs on Admission:  Basic Metabolic Panel: Recent Labs  Lab 05/08/24 1138  NA 141  K 2.8*  CL 102  CO2 30  GLUCOSE 112*  BUN 5*  CREATININE 0.62  CALCIUM 8.1*   Liver Function Tests: Recent Labs  Lab 05/08/24 1138  AST 97*  ALT 35  ALKPHOS 158*  BILITOT 0.6  PROT 6.6  ALBUMIN 2.6*   Recent Labs  Lab 05/08/24 1138  LIPASE 25   No results for input(s): "AMMONIA" in the last 168 hours. CBC: Recent Labs  Lab 05/08/24 1138  WBC 8.1  HGB 12.2  HCT 35.7*  MCV 114.4*  PLT 142*   Cardiac Enzymes: No results for input(s): "CKTOTAL", "CKMB", "CKMBINDEX", "TROPONINI" in the last 168 hours. BNP (last 3 results) No results for  input(s): "BNP" in the last 8760 hours.  ProBNP (last 3 results) No results for input(s): "PROBNP" in the last 8760 hours.  CBG: No results for input(s): "GLUCAP" in the last 168 hours.  Radiological Exams on Admission: US  Abdomen Limited RUQ (LIVER/GB) Result Date: 05/08/2024 CLINICAL DATA:  151471 RUQ pain 151471. EXAM: ULTRASOUND ABDOMEN LIMITED RIGHT UPPER QUADRANT COMPARISON:  04/15/2024. FINDINGS: Gallbladder: Physiologically distended. Small volume gallstones and sludge noted. No abnormal wall thickening or pericholecystic free fluid. Sonographic Murphy's sign was negative as per the technologist. Common bile duct: Diameter: Up to 2.1 mm.  No intrahepatic bile duct dilation. Liver: There is poor sound beam penetration to the deep / posterior aspects of the liver as a result of increased hepatic echogenicity which reduces  the sensitivity of ultrasound for the detection of focal masses. That being said, no focal mass is identified. Portal vein is patent on color Doppler imaging with normal direction of blood flow towards the liver. Other: None. IMPRESSION: 1. Cholelithiasis without acute cholecystitis. 2. Increased hepatic echogenicity, a nonspecific finding that is most commonly seen on the basis of steatosis in the absence of known liver disease. Electronically Signed   By: Beula Brunswick M.D.   On: 05/08/2024 13:41   Assessment/Plan Sonya Hurst is a 43 y.o. 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  Right upper quadrant abdominal pain-pain and nausea, intractable, patient is unable to tolerate p.o. diet.  Most likely this is due to symptomatic cholelithiasis, no evidence of acute cholecystitis. -Observation admission -Pain and nausea control as needed -Will make n.p.o. after  midnight, anticipate surgical consultation in the morning  Hypokalemia-likely due to recent GI losses from vomiting, has been repleted p.o. and IV, will check magnesium level and follow electrolytes with morning labs  History of DVT-has been on Eliquis  since April 2023.  Reports intermittent compliance, states she last took her Eliquis  on 5/19.  This medication can be safely discontinued.  GERD-omeprazole  Hypertension-lisinopril   Neuropathy-likely related to alcohol abuse, continue home gabapentin   Alcohol abuse-with previous history of early cirrhotic changes -Thiamine , folate, multivitamin -P.o. Ativan per CIWA protocol  DVT prophylaxis: Lovenox     Code Status: Full Code  Consults called: None  Admission status: ER provider discussed with general surgery, who plans to consult in the morning  Time spent: 59 minutes  Letizia Hook Rickey Charm MD [facility] Pager [phone]  If 7PM-7AM, please contact night-coverage [email or web address] Password [email or web address]  05/08/2024, 4:52 PM

## 2024-05-08 NOTE — ED Provider Notes (Signed)
 [place] EMERGENCY DEPARTMENT AT [facility] Provider Note   CSN: [id number] Arrival date & time: 05/08/24  1114     History  Chief Complaint  Patient presents with   Emesis    Sonya Hurst is a 43 y.o. female.  43 year old female with known cholelithiasis and nausea and vomiting for the past month or so presenting to the emergency department today with worsening symptoms.  The patient states she has been having ongoing right upper quadrant pain.  She has been evaluated as an outpatient and the plan is for potential cholecystectomy.  The patient does have a history of pulmonary embolism and and is on Eliquis  so the plan is to coordinate her care to stop her Eliquis  prior to surgery.  She states that she has been taking Zofran  at home with no improvement.  States that she has been vomiting multiple times per day and cannot keep anything down.  She came to the emergency department today for further evaluation regarding this.   Emesis Associated symptoms: abdominal pain        Home Medications Prior to Admission medications   Medication Sig Start Date End Date Taking? Authorizing Provider  APIXABAN  (ELIQUIS ) VTE STARTER PACK (10MG  AND 5MG ) Take as directed on package: start with two-5mg  tablets twice daily for 7 days. On day 8, switch to one-5mg  tablet twice daily. Patient taking differently: one-5mg  tablet twice daily. 07/28/22   Roemhildt, Lorin T, PA-C  cetirizine (ZYRTEC) 10 MG tablet Take 10 mg by mouth daily.    [provider]  fluconazole  (DIFLUCAN ) 150 MG tablet Take one tab PO today and repeat dose in 3 days if symptoms persist 09/06/23   Vernestine Gondola, PA-C  fluticasone [facility]) 50 MCG/ACT nasal spray Place 2 sprays into both nostrils daily.    [provider]  furosemide (LASIX) 40 MG tablet Take 40 mg by mouth daily. 11/25/23   [provider]  gabapentin  (NEURONTIN ) 300 MG capsule Take 1 capsule (300 mg) by mouth in the  morning and take 2 capsules (600 mg) by mouth in the evening 04/11/24   Ellene Gustin, MD  lisinopril  (ZESTRIL ) 30 MG tablet Take 30 mg by mouth daily. 11/27/23   [provider]  methocarbamol (ROBAXIN) 750 MG tablet Take 750 mg by mouth every 8 (eight) hours as needed. 09/01/22   [provider]  ondansetron  (ZOFRAN ) 4 MG tablet Take 1 tablet (4 mg total) by mouth every 6 (six) hours. 04/15/24   Juanetta Nordmann, PA  Potassium Chloride  40 MEQ/15ML (20%) SOLN daily. 03/04/24   [provider]  thiamine  (VITAMIN B1) 100 MG tablet Take 1 tablet (100 mg total) by mouth daily. 12/21/23   Ellene Gustin, MD  traZODone (DESYREL) 100 MG tablet Take 100 mg by mouth at bedtime as needed. 02/13/24   [provider]  Vitamin D , Ergocalciferol , (DRISDOL ) 1.25 MG (50000 UNIT) CAPS capsule Take 1 capsule (50,000 Units total) by mouth every 7 (seven) days. 12/21/23   Ellene Gustin, MD  WEGOVY 0.25 MG/0.5ML SOAJ Inject 0.25 mg into the skin once a week. 12/17/23   [provider]      Allergies    Olopatadine hcl    Review of Systems   Review of Systems  Gastrointestinal:  Positive for abdominal pain, nausea and vomiting.  All other systems reviewed and are negative.   Physical Exam Updated Vital Signs BP 111/65 (BP Location: Left Wrist)   Pulse 94  Temp 97.9 F (36.6 C) (Oral)   Resp 20   LMP  (LMP Unknown)   SpO2 95%  Physical Exam Vitals and nursing note reviewed.   Gen: NAD Eyes: PERRL, EOMI HEENT: no oropharyngeal swelling, dry mucous membranes Neck: trachea midline Resp: clear to auscultation bilaterally Card: RRR, no murmurs, rubs, or gallops Abd: The patient is tender over the right upper quadrant with guarding to deep palpation, is also mild epigastric tenderness noted Extremities: no calf tenderness, no edema Vascular: 2+ radial pulses bilaterally, 2+ DP pulses bilaterally Skin: no rashes Psyc: acting appropriately   ED Results /  Procedures / Treatments   Labs (all labs ordered are listed, but only abnormal results are displayed) Labs Reviewed  COMPREHENSIVE METABOLIC PANEL WITH GFR - Abnormal; Notable for the following components:      Result Value   Potassium 2.8 (*)    Glucose, Bld 112 (*)    BUN 5 (*)    Calcium 8.1 (*)    Albumin 2.6 (*)    AST 97 (*)    Alkaline Phosphatase 158 (*)    All other components within normal limits  CBC - Abnormal; Notable for the following components:   RBC 3.12 (*)    HCT 35.7 (*)    MCV 114.4 (*)    MCH 39.1 (*)    RDW 15.6 (*)    Platelets 142 (*)    All other components within normal limits  URINALYSIS, ROUTINE W REFLEX MICROSCOPIC - Abnormal; Notable for the following components:   Color, Urine AMBER (*)    APPearance CLOUDY (*)    Hgb urine dipstick LARGE (*)    Bilirubin Urine SMALL (*)    Protein, ur 100 (*)    Leukocytes,Ua SMALL (*)    Bacteria, UA MANY (*)    All other components within normal limits  LIPASE, BLOOD  HCG, SERUM, QUALITATIVE    EKG None  Radiology US  Abdomen Limited RUQ (LIVER/GB) Result Date: 05/08/2024 CLINICAL DATA:  151471 RUQ pain 151471. EXAM: ULTRASOUND ABDOMEN LIMITED RIGHT UPPER QUADRANT COMPARISON:  04/15/2024. FINDINGS: Gallbladder: Physiologically distended. Small volume gallstones and sludge noted. No abnormal wall thickening or pericholecystic free fluid. Sonographic Murphy's sign was negative as per the technologist. Common bile duct: Diameter: Up to 2.1 mm.  No intrahepatic bile duct dilation. Liver: There is poor sound beam penetration to the deep / posterior aspects of the liver as a result of increased hepatic echogenicity which reduces the sensitivity of ultrasound for the detection of focal masses. That being said, no focal mass is identified. Portal vein is patent on color Doppler imaging with normal direction of blood flow towards the liver. Other: None. IMPRESSION: 1. Cholelithiasis without acute cholecystitis. 2.  Increased hepatic echogenicity, a nonspecific finding that is most commonly seen on the basis of steatosis in the absence of known liver disease. Electronically Signed   By: Beula Brunswick M.D.   On: 05/08/2024 13:41    Procedures Procedures    Medications Ordered in ED Medications  potassium chloride  SA (KLOR-CON  M) CR tablet 40 mEq (has no administration in time range)  lactated ringers  bolus 1,000 mL (1,000 mLs Intravenous New Bag/Given 05/08/24 1317)  lactated ringers  bolus 1,000 mL (0 mLs Intravenous Stopped 05/08/24 1544)  morphine (PF) 4 MG/ML injection 4 mg (4 mg Intravenous Given 05/08/24 1326)  promethazine (PHENERGAN) 12.5 mg in sodium chloride  0.9 % 50 mL IVPB (12.5 mg Intravenous New Bag/Given 05/08/24 1326)    ED Course/ Medical Decision  Making/ A&P                                 Medical Decision Making 43 year old female with past medical history of cholelithiasis and VTE in the past presenting to the emergency department today with persistent nausea vomiting worsening right upper quadrant abdominal pain.  Will further evaluate her here with basic labs including LFTs and a lipase in addition to a repeat ultrasound to evaluate for cholecystitis.  I will give the patient IV fluids here.  Will give her morphine and Phenergan to see if helps with her symptoms.  I will reevaluate for ultimate disposition.  I spoke with Dr. Winslow Hawk office.  Recommended admission if the patient was still symptomatic or possible discharge if symptoms have resolved.  With her being on Eliquis  they recommended admission to the hospitalist service.  On reassessment the patient reports that she is still having nausea and pain.  Calls placed to hospitalist service for admission.  Amount and/or Complexity of Data Reviewed Labs: ordered. Radiology: ordered.  Risk Prescription drug management. Decision regarding hospitalization.           Final Clinical Impression(s) / ED Diagnoses Final  diagnoses:  Symptomatic cholelithiasis  Hypokalemia    Rx / DC Orders ED Discharge Orders     None         Carin Charleston, MD 05/08/24 1627

## 2024-05-08 NOTE — ED Notes (Signed)
 [place] in room

## 2024-05-08 NOTE — ED Triage Notes (Signed)
 Pt states she has been vomiting since April 29th. Having diarrhea on and off as well. Pt states she was told she needs to have her gallbladder removed.

## 2024-05-09 ENCOUNTER — Encounter (HOSPITAL_COMMUNITY): Payer: Self-pay | Admitting: Internal Medicine

## 2024-05-09 ENCOUNTER — Observation Stay (HOSPITAL_COMMUNITY)

## 2024-05-09 ENCOUNTER — Encounter (HOSPITAL_COMMUNITY): Admission: EM | Disposition: A | Discharge status: Home / Self Care | Payer: Self-pay | Source: Home / Self Care

## 2024-05-09 DIAGNOSIS — I10 Essential (primary) hypertension: Secondary | ICD-10-CM | POA: Diagnosis not present

## 2024-05-09 DIAGNOSIS — E876 Hypokalemia: Secondary | ICD-10-CM

## 2024-05-09 DIAGNOSIS — Z7901 Long term (current) use of anticoagulants: Secondary | ICD-10-CM | POA: Diagnosis not present

## 2024-05-09 DIAGNOSIS — R1110 Vomiting, unspecified: Secondary | ICD-10-CM | POA: Diagnosis not present

## 2024-05-09 DIAGNOSIS — F17210 Nicotine dependence, cigarettes, uncomplicated: Secondary | ICD-10-CM

## 2024-05-09 DIAGNOSIS — F1010 Alcohol abuse, uncomplicated: Secondary | ICD-10-CM | POA: Diagnosis not present

## 2024-05-09 DIAGNOSIS — R7401 Elevation of levels of liver transaminase levels: Secondary | ICD-10-CM | POA: Diagnosis not present

## 2024-05-09 DIAGNOSIS — K8012 Calculus of gallbladder with acute and chronic cholecystitis without obstruction: Secondary | ICD-10-CM | POA: Diagnosis not present

## 2024-05-09 DIAGNOSIS — K429 Umbilical hernia without obstruction or gangrene: Secondary | ICD-10-CM | POA: Diagnosis not present

## 2024-05-09 DIAGNOSIS — K420 Umbilical hernia with obstruction, without gangrene: Secondary | ICD-10-CM | POA: Diagnosis not present

## 2024-05-09 DIAGNOSIS — Z9049 Acquired absence of other specified parts of digestive tract: Secondary | ICD-10-CM | POA: Diagnosis not present

## 2024-05-09 DIAGNOSIS — Z86718 Personal history of other venous thrombosis and embolism: Secondary | ICD-10-CM | POA: Diagnosis not present

## 2024-05-09 DIAGNOSIS — K7581 Nonalcoholic steatohepatitis (NASH): Secondary | ICD-10-CM | POA: Diagnosis not present

## 2024-05-09 DIAGNOSIS — K8010 Calculus of gallbladder with chronic cholecystitis without obstruction: Secondary | ICD-10-CM | POA: Diagnosis not present

## 2024-05-09 DIAGNOSIS — Z79899 Other long term (current) drug therapy: Secondary | ICD-10-CM | POA: Diagnosis not present

## 2024-05-09 DIAGNOSIS — K8020 Calculus of gallbladder without cholecystitis without obstruction: Secondary | ICD-10-CM | POA: Diagnosis not present

## 2024-05-09 HISTORY — PX: CHOLECYSTECTOMY: SHX55

## 2024-05-09 LAB — COMPREHENSIVE METABOLIC PANEL WITH GFR
ALT: 27 U/L (ref 0–44)
AST: 67 U/L — ABNORMAL HIGH (ref 15–41)
Albumin: 2.1 g/dL — ABNORMAL LOW (ref 3.5–5.0)
Alkaline Phosphatase: 117 U/L (ref 38–126)
Anion gap: 8 (ref 5–15)
BUN: 6 mg/dL (ref 6–20)
CO2: 28 mmol/L (ref 22–32)
Calcium: 7.9 mg/dL — ABNORMAL LOW (ref 8.9–10.3)
Chloride: 106 mmol/L (ref 98–111)
Creatinine, Ser: 0.48 mg/dL (ref 0.44–1.00)
GFR, Estimated: >60 mL/min (ref >60)
Glucose, Bld: 95 mg/dL (ref 70–99)
Potassium: 3.1 mmol/L — ABNORMAL LOW (ref 3.5–5.1)
Sodium: 142 mmol/L (ref 135–145)
Total Bilirubin: 1 mg/dL (ref 0.0–1.2)
Total Protein: 5.4 g/dL — ABNORMAL LOW (ref 6.5–8.1)

## 2024-05-09 LAB — CBC
HCT: 30.1 % — ABNORMAL LOW (ref 36.0–46.0)
Hemoglobin: 10.4 g/dL — ABNORMAL LOW (ref 12.0–15.0)
MCH: 39.5 pg — ABNORMAL HIGH (ref 26.0–34.0)
MCHC: 34.6 g/dL (ref 30.0–36.0)
MCV: 114.4 fL — ABNORMAL HIGH (ref 80.0–100.0)
Platelets: 121 10*3/uL — ABNORMAL LOW (ref 150–400)
RBC: 2.63 MIL/uL — ABNORMAL LOW (ref 3.87–5.11)
RDW: 15.8 % — ABNORMAL HIGH (ref 11.5–15.5)
WBC: 6 10*3/uL (ref 4.0–10.5)
nRBC: 0 % (ref 0.0–0.2)

## 2024-05-09 LAB — HIV ANTIBODY (ROUTINE TESTING W REFLEX): HIV Screen 4th Generation wRfx: NONREACTIVE

## 2024-05-09 SURGERY — LAPAROSCOPIC CHOLECYSTECTOMY WITH INTRAOPERATIVE CHOLANGIOGRAM
Anesthesia: General | Site: Abdomen

## 2024-05-09 MED ORDER — ALUM & MAG HYDROXIDE-SIMETH 200-200-20 MG/5ML PO SUSP: 30.0000 mL | Freq: Four times a day (QID) | ORAL | Status: DC | PRN

## 2024-05-09 MED ORDER — KETAMINE HCL 10 MG/ML IJ SOLN
INTRAMUSCULAR | Status: DC | PRN
  Administered 2024-05-09: 30 mg via INTRAVENOUS

## 2024-05-09 MED ORDER — SODIUM CHLORIDE 0.9% FLUSH
3.0000 mL | Freq: Two times a day (BID) | INTRAVENOUS | Status: DC
  Administered 2024-05-09 – 2024-05-10 (×2): 3 mL via INTRAVENOUS

## 2024-05-09 MED ORDER — CHLORHEXIDINE GLUCONATE CLOTH 2 % EX PADS: 6.0000 | MEDICATED_PAD | Freq: Once | CUTANEOUS | Status: DC

## 2024-05-09 MED ORDER — MENTHOL 3 MG MT LOZG: 1.0000 | LOZENGE | OROMUCOSAL | Status: DC | PRN

## 2024-05-09 MED ORDER — ROCURONIUM BROMIDE 100 MG/10ML IV SOLN
INTRAVENOUS | Status: DC | PRN
  Administered 2024-05-09: 10 mg via INTRAVENOUS
  Administered 2024-05-09: 80 mg via INTRAVENOUS

## 2024-05-09 MED ORDER — POTASSIUM CHLORIDE 10 MEQ/100ML IV SOLN
10.0000 meq | INTRAVENOUS | Status: DC
  Filled 2024-05-09 (×2): qty 100

## 2024-05-09 MED ORDER — PHENYLEPHRINE 80 MCG/ML (10ML) SYRINGE FOR IV PUSH (FOR BLOOD PRESSURE SUPPORT)
PREFILLED_SYRINGE | INTRAVENOUS | Status: AC
  Filled 2024-05-09: qty 10

## 2024-05-09 MED ORDER — OXYCODONE HCL 5 MG PO TABS: 5.0000 mg | ORAL_TABLET | Freq: Once | ORAL | Status: DC | PRN

## 2024-05-09 MED ORDER — PROPOFOL 10 MG/ML IV BOLUS
INTRAVENOUS | Status: AC
  Filled 2024-05-09: qty 20

## 2024-05-09 MED ORDER — SALINE SPRAY 0.65 % NA SOLN: 1.0000 | Freq: Four times a day (QID) | NASAL | Status: DC | PRN

## 2024-05-09 MED ORDER — CALCIUM POLYCARBOPHIL 625 MG PO TABS
625.0000 mg | ORAL_TABLET | Freq: Two times a day (BID) | ORAL | Status: DC
  Administered 2024-05-09 – 2024-05-10 (×2): 625 mg via ORAL
  Filled 2024-05-09 (×2): qty 1

## 2024-05-09 MED ORDER — MIDAZOLAM HCL 5 MG/5ML IJ SOLN
INTRAMUSCULAR | Status: DC | PRN
  Administered 2024-05-09: 1 mg via INTRAVENOUS

## 2024-05-09 MED ORDER — FENTANYL CITRATE (PF) 100 MCG/2ML IJ SOLN
INTRAMUSCULAR | Status: DC | PRN
  Administered 2024-05-09: 25 ug via INTRAVENOUS
  Administered 2024-05-09: 100 ug via INTRAVENOUS

## 2024-05-09 MED ORDER — FENTANYL CITRATE PF 50 MCG/ML IJ SOSY: 25.0000 ug | PREFILLED_SYRINGE | INTRAMUSCULAR | Status: DC | PRN

## 2024-05-09 MED ORDER — ONDANSETRON HCL 4 MG/2ML IJ SOLN
INTRAMUSCULAR | Status: AC
  Filled 2024-05-09: qty 2

## 2024-05-09 MED ORDER — KETAMINE HCL 50 MG/5ML IJ SOSY
PREFILLED_SYRINGE | INTRAMUSCULAR | Status: AC
  Filled 2024-05-09: qty 5

## 2024-05-09 MED ORDER — CEFAZOLIN SODIUM-DEXTROSE 3-4 GM/150ML-% IV SOLN
3.0000 g | INTRAVENOUS | Status: AC
  Administered 2024-05-09: 3 g via INTRAVENOUS
  Filled 2024-05-09: qty 150

## 2024-05-09 MED ORDER — SUCCINYLCHOLINE CHLORIDE 200 MG/10ML IV SOSY
PREFILLED_SYRINGE | INTRAVENOUS | Status: AC
Start: 2024-05-09 — End: ?
  Filled 2024-05-09: qty 10

## 2024-05-09 MED ORDER — NAPHAZOLINE-GLYCERIN 0.012-0.25 % OP SOLN: 1.0000 [drp] | Freq: Four times a day (QID) | OPHTHALMIC | Status: DC | PRN

## 2024-05-09 MED ORDER — PHENYLEPHRINE HCL (PRESSORS) 10 MG/ML IV SOLN
INTRAVENOUS | Status: DC | PRN
Start: 2024-05-09 — End: 2024-05-09
  Administered 2024-05-09 (×5): 80 ug via INTRAVENOUS
  Administered 2024-05-09: 160 ug via INTRAVENOUS

## 2024-05-09 MED ORDER — LIDOCAINE HCL 1 % IJ SOLN
INTRAMUSCULAR | Status: AC
  Filled 2024-05-09: qty 20

## 2024-05-09 MED ORDER — BUPIVACAINE-EPINEPHRINE (PF) 0.25% -1:200000 IJ SOLN
INTRAMUSCULAR | Status: AC
  Filled 2024-05-09: qty 30

## 2024-05-09 MED ORDER — POTASSIUM CHLORIDE 20 MEQ PO PACK: 40.0000 meq | PACK | Freq: Once | ORAL | Status: DC

## 2024-05-09 MED ORDER — SIMETHICONE 40 MG/0.6ML PO SUSP: 80.0000 mg | Freq: Four times a day (QID) | ORAL | Status: DC | PRN

## 2024-05-09 MED ORDER — DROPERIDOL 2.5 MG/ML IJ SOLN: 0.6250 mg | Freq: Once | INTRAMUSCULAR | Status: DC | PRN

## 2024-05-09 MED ORDER — GABAPENTIN 300 MG PO CAPS
300.0000 mg | ORAL_CAPSULE | ORAL | Status: AC
  Administered 2024-05-09: 300 mg via ORAL
  Filled 2024-05-09: qty 1

## 2024-05-09 MED ORDER — SODIUM CHLORIDE 0.9 % IV SOLN: 250.0000 mL | INTRAVENOUS | Status: DC | PRN

## 2024-05-09 MED ORDER — FENTANYL CITRATE (PF) 250 MCG/5ML IJ SOLN
INTRAMUSCULAR | Status: AC
  Filled 2024-05-09: qty 5

## 2024-05-09 MED ORDER — HYDROMORPHONE HCL 1 MG/ML IJ SOLN: 0.5000 mg | INTRAMUSCULAR | Status: DC | PRN

## 2024-05-09 MED ORDER — OXYCODONE-ACETAMINOPHEN 5-325 MG PO TABS: 1.0000 | ORAL_TABLET | Freq: Four times a day (QID) | ORAL | 0 refills | Status: AC | PRN

## 2024-05-09 MED ORDER — SODIUM CHLORIDE 0.9 % IV SOLN
2.0000 g | INTRAVENOUS | Status: AC
  Administered 2024-05-09: 2 g via INTRAVENOUS
  Filled 2024-05-09: qty 20

## 2024-05-09 MED ORDER — BUPIVACAINE LIPOSOME 1.3 % IJ SUSP: 20.0000 mL | Freq: Once | INTRAMUSCULAR | Status: DC

## 2024-05-09 MED ORDER — ROCURONIUM BROMIDE 10 MG/ML (PF) SYRINGE
PREFILLED_SYRINGE | INTRAVENOUS | Status: AC
  Filled 2024-05-09: qty 10

## 2024-05-09 MED ORDER — BISACODYL 10 MG RE SUPP: 10.0000 mg | Freq: Two times a day (BID) | RECTAL | Status: DC | PRN

## 2024-05-09 MED ORDER — GLYCOPYRROLATE 0.2 MG/ML IJ SOLN
INTRAMUSCULAR | Status: AC
  Filled 2024-05-09: qty 1

## 2024-05-09 MED ORDER — LACTATED RINGERS IV SOLN: INTRAVENOUS | Status: DC | PRN

## 2024-05-09 MED ORDER — PROPOFOL 10 MG/ML IV BOLUS
INTRAVENOUS | Status: DC | PRN
  Administered 2024-05-09: 50 mg via INTRAVENOUS
  Administered 2024-05-09: 200 mg via INTRAVENOUS
  Administered 2024-05-09: 50 mg via INTRAVENOUS

## 2024-05-09 MED ORDER — METHOCARBAMOL 500 MG PO TABS: 1000.0000 mg | ORAL_TABLET | Freq: Four times a day (QID) | ORAL | Status: DC | PRN

## 2024-05-09 MED ORDER — DEXAMETHASONE SODIUM PHOSPHATE 10 MG/ML IJ SOLN
INTRAMUSCULAR | Status: AC
  Filled 2024-05-09: qty 1

## 2024-05-09 MED ORDER — SUCCINYLCHOLINE CHLORIDE 200 MG/10ML IV SOSY
PREFILLED_SYRINGE | INTRAVENOUS | Status: DC | PRN
Start: 2024-05-09 — End: 2024-05-09
  Administered 2024-05-09: 160 mg via INTRAVENOUS

## 2024-05-09 MED ORDER — MIDAZOLAM HCL 2 MG/2ML IJ SOLN
INTRAMUSCULAR | Status: AC
  Filled 2024-05-09: qty 2

## 2024-05-09 MED ORDER — ONDANSETRON HCL 4 MG/2ML IJ SOLN
INTRAMUSCULAR | Status: DC | PRN
  Administered 2024-05-09: 4 mg via INTRAVENOUS

## 2024-05-09 MED ORDER — BUPIVACAINE-EPINEPHRINE 0.25% -1:200000 IJ SOLN
INTRAMUSCULAR | Status: DC | PRN
  Administered 2024-05-09: 30 mL

## 2024-05-09 MED ORDER — LIDOCAINE HCL (CARDIAC) PF 100 MG/5ML IV SOSY
PREFILLED_SYRINGE | INTRAVENOUS | Status: DC | PRN
  Administered 2024-05-09: 100 mg via INTRAVENOUS

## 2024-05-09 MED ORDER — DEXAMETHASONE SODIUM PHOSPHATE 10 MG/ML IJ SOLN
INTRAMUSCULAR | Status: DC | PRN
Start: 2024-05-09 — End: 2024-05-09
  Administered 2024-05-09: 5 mg via INTRAVENOUS

## 2024-05-09 MED ORDER — PROCHLORPERAZINE EDISYLATE 10 MG/2ML IJ SOLN
5.0000 mg | INTRAMUSCULAR | Status: DC | PRN
  Administered 2024-05-09: 10 mg via INTRAVENOUS
  Filled 2024-05-09: qty 2

## 2024-05-09 MED ORDER — METHOCARBAMOL 1000 MG/10ML IJ SOLN: 1000.0000 mg | Freq: Four times a day (QID) | INTRAMUSCULAR | Status: DC | PRN

## 2024-05-09 MED ORDER — IOPAMIDOL (ISOVUE-300) INJECTION 61%
INTRAVENOUS | Status: DC | PRN
  Administered 2024-05-09: 8 mL

## 2024-05-09 MED ORDER — ACETAMINOPHEN 500 MG PO TABS
1000.0000 mg | ORAL_TABLET | Freq: Four times a day (QID) | ORAL | Status: DC
  Administered 2024-05-09 – 2024-05-10 (×3): 1000 mg via ORAL
  Filled 2024-05-09 (×3): qty 2

## 2024-05-09 MED ORDER — BUPIVACAINE LIPOSOME 1.3 % IJ SUSP
INTRAMUSCULAR | Status: AC
  Filled 2024-05-09: qty 20

## 2024-05-09 MED ORDER — MAGIC MOUTHWASH: 15.0000 mL | Freq: Four times a day (QID) | ORAL | Status: DC | PRN

## 2024-05-09 MED ORDER — OXYCODONE HCL 5 MG PO TABS
5.0000 mg | ORAL_TABLET | ORAL | Status: DC | PRN
  Administered 2024-05-09: 10 mg via ORAL
  Filled 2024-05-09: qty 2

## 2024-05-09 MED ORDER — MAGNESIUM SULFATE IN D5W 1-5 GM/100ML-% IV SOLN
1.0000 g | Freq: Once | INTRAVENOUS | Status: DC
  Filled 2024-05-09: qty 100

## 2024-05-09 MED ORDER — ACETAMINOPHEN 10 MG/ML IV SOLN: 1000.0000 mg | Freq: Once | INTRAVENOUS | Status: DC | PRN

## 2024-05-09 MED ORDER — DIPHENHYDRAMINE HCL 50 MG/ML IJ SOLN: 12.5000 mg | Freq: Four times a day (QID) | INTRAMUSCULAR | Status: DC | PRN

## 2024-05-09 MED ORDER — OXYCODONE HCL 5 MG/5ML PO SOLN: 5.0000 mg | Freq: Once | ORAL | Status: DC | PRN

## 2024-05-09 MED ORDER — SODIUM CHLORIDE 0.9% FLUSH: 3.0000 mL | INTRAVENOUS | Status: DC | PRN

## 2024-05-09 MED ORDER — LACTATED RINGERS IR SOLN
Status: DC | PRN
  Administered 2024-05-09: 1000 mL

## 2024-05-09 MED ORDER — ENOXAPARIN SODIUM 80 MG/0.8ML IJ SOSY
70.0000 mg | PREFILLED_SYRINGE | INTRAMUSCULAR | Status: DC
  Filled 2024-05-09: qty 0.8

## 2024-05-09 MED ORDER — BUPIVACAINE LIPOSOME 1.3 % IJ SUSP
INTRAMUSCULAR | Status: DC | PRN
  Administered 2024-05-09: 20 mL

## 2024-05-09 MED ORDER — INDOCYANINE GREEN 25 MG IV SOLR
1.2500 mg | Freq: Once | INTRAVENOUS | Status: AC
  Administered 2024-05-09: 1.25 mg via INTRAVENOUS
  Filled 2024-05-09: qty 10

## 2024-05-09 MED ORDER — PHENOL 1.4 % MT LIQD: 2.0000 | OROMUCOSAL | Status: DC | PRN

## 2024-05-09 MED ORDER — ACETAMINOPHEN 500 MG PO TABS
1000.0000 mg | ORAL_TABLET | ORAL | Status: AC
  Administered 2024-05-09: 1000 mg via ORAL
  Filled 2024-05-09: qty 2

## 2024-05-09 MED ORDER — SUGAMMADEX SODIUM 200 MG/2ML IV SOLN
INTRAVENOUS | Status: DC | PRN
  Administered 2024-05-09 (×2): 200 mg via INTRAVENOUS

## 2024-05-09 MED ORDER — CHLORHEXIDINE GLUCONATE 0.12 % MT SOLN: 15.0000 mL | Freq: Once | OROMUCOSAL | Status: DC

## 2024-05-09 SURGICAL SUPPLY — 40 items
BAG COUNTER SPONGE SURGICOUNT (BAG) ×2 IMPLANT
CABLE HIGH FREQUENCY MONO STRZ (ELECTRODE) IMPLANT
CLIP APPLIE 5 13 M/L LIGAMAX5 (MISCELLANEOUS) IMPLANT
CLIP APPLIE ROT 10 11.4 M/L (STAPLE) IMPLANT
COVER MAYO STAND XLG (MISCELLANEOUS) ×2 IMPLANT
COVER SURGICAL LIGHT HANDLE (MISCELLANEOUS) ×2 IMPLANT
DRAPE 3/4 80X56 (DRAPES) IMPLANT
DRAPE C-ARM 42X120 X-RAY (DRAPES) ×2 IMPLANT
DRAPE UTILITY XL STRL (DRAPES) ×2 IMPLANT
DRAPE WARM FLUID 44X44 (DRAPES) ×2 IMPLANT
DRSG TEGADERM 2-3/8X2-3/4 SM (GAUZE/BANDAGES/DRESSINGS) ×4 IMPLANT
DRSG TEGADERM 4X4.75 (GAUZE/BANDAGES/DRESSINGS) IMPLANT
DRSG TEGADERM 6X8 (GAUZE/BANDAGES/DRESSINGS) ×2 IMPLANT
DRSG TELFA 3X8 NADH STRL (GAUZE/BANDAGES/DRESSINGS) IMPLANT
ELECT REM PT RETURN 15FT ADLT (MISCELLANEOUS) ×2 IMPLANT
ENDOLOOP SUT PDS II 0 18 (SUTURE) IMPLANT
GAUZE SPONGE 2X2 8PLY STRL LF (GAUZE/BANDAGES/DRESSINGS) IMPLANT
GLOVE ECLIPSE 8.0 STRL XLNG CF (GLOVE) ×2 IMPLANT
GLOVE INDICATOR 8.0 STRL GRN (GLOVE) ×2 IMPLANT
GOWN STRL REUS W/ TWL XL LVL3 (GOWN DISPOSABLE) ×2 IMPLANT
IRRIGATION SUCT STRKRFLW 2 WTP (MISCELLANEOUS) ×2 IMPLANT
KIT BASIN OR (CUSTOM PROCEDURE TRAY) ×2 IMPLANT
KIT TURNOVER KIT A (KITS) IMPLANT
NDL BIOPSY 14X6 SOFT TISS (NEEDLE) IMPLANT
NEEDLE BIOPSY 14X6 SOFT TISS (NEEDLE) ×1 IMPLANT
PENCIL SMOKE EVACUATOR (MISCELLANEOUS) IMPLANT
POUCH RETRIEVAL ECOSAC 10 (ENDOMECHANICALS) ×2 IMPLANT
SCISSORS LAP 5X35 DISP (ENDOMECHANICALS) ×2 IMPLANT
SET CHOLANGIOGRAPH MIX (MISCELLANEOUS) ×2 IMPLANT
SET TUBE SMOKE EVAC HIGH FLOW (TUBING) ×2 IMPLANT
SLEEVE ADV FIXATION 5X100MM (TROCAR) ×2 IMPLANT
SPIKE FLUID TRANSFER (MISCELLANEOUS) ×2 IMPLANT
SUT MNCRL AB 4-0 PS2 18 (SUTURE) ×2 IMPLANT
SUT PDS AB 1 CT 36 (SUTURE) ×2 IMPLANT
SYR 20ML LL LF (SYRINGE) ×2 IMPLANT
TOWEL OR 17X26 10 PK STRL BLUE (TOWEL DISPOSABLE) ×2 IMPLANT
TRAY LAPAROSCOPIC (CUSTOM PROCEDURE TRAY) ×2 IMPLANT
TROCAR ADV FIXATION 12X100MM (TROCAR) ×2 IMPLANT
TROCAR ADV FIXATION 5X100MM (TROCAR) ×2 IMPLANT
TROCAR Z-THREAD OPTICAL 5X100M (TROCAR) ×2 IMPLANT

## 2024-05-09 NOTE — Progress Notes (Signed)
 Short stay called for report, report given to [facility] pt NPO, pt to consent in short stay unit, transporter at bedside to receive patient unable to give magnesium at this time, short stay aware, CHG bath completed and patient placed in gown, all jewelery removed, belongings left at bedside per pt request.

## 2024-05-09 NOTE — Consult Note (Addendum)
 Sonya Hurst 25-Feb-1981  [id number].    Requesting MD: Alfonse Angle, MD  Chief Complaint/Reason for Consult: intractable nausea/vomiting  HPI:  43 year old woman with history of morbid obesity, alcohol abuse, tobacco abuse, depression, GERD, DVT (2023) on Eliquis  (LD 5/19), hypertension, insomnia, migraines, neuropathy, and urinary incontinence who presents with RUQ pain and nausea vomiting.   She was seen in the ED 4/29 for abdominal pain associated with diarrhea, nausea, vomiting and fatigue. She had an EGD and colonoscopy pending (colonoscopy screening, EGD due to concern of biliary disease) but these were canceled due to persistent nausea and vomiting.  Colonoscopy now scheduled for 6/17.  Ultimately symptoms were controlled and she was discharged with outpatient follow-up.    She ports ongoing nausea and postprandial emesis since that time. She has ongoing intermittent RUQ pain and constipation. She was seen in our office by Dr. Lanell Pinta 5/13 who recommended cholecystectomy, possible cholangiogram, for biliary colic. Yesterday 5/22 her RUQ pain recurred and she has been unable to keep down PO intake.      Ultrasound 05/08/24: Distended gallbladder with layering stones and no cholecystitis.    CT abdomen pelvis 04/15/24: Hepatic steatosis with an enlarged caudate lobe and mild capsular nodularity in the right hepatic lobe raising suspicion for cirrhosis, but no hepatic masses.  Unremarkable gallbladder, fatty infiltration of submucosa in the right colon without evidence of colitis, small fat-containing umbilical hernia.  Surgical history significant  for tubal ligation.  Reports smoking 3-4 cigaretes daily  Reports drinking one can of beer daily since January. Prior to that she reports drinking two bottles of wine daily. Says she cut back because her neurologist said the drinking was the cause of her neuropathy.  She denies a history of cardiac issues. States she was sent to cardiologist for  SOB but they think her heart is fine. States she cannot walk far due to the neuropathy so she gets winded with walking long distances. Per cardiology note 5/13 patient had no angina and no sxs of CHF. They ordered echo for completeness to assess cardiac function and it has not be done - appears to be scheduled for 6/18?   ROS: Review of Systems  All other systems reviewed and are negative.   Family History  Problem Relation Age of Onset   Sleep apnea Father     Past Medical History:  Diagnosis Date   Alcohol abuse    BMI 50.0-59.9, adult (HCC)    Cigarette nicotine dependence with other nicotine-induced disorder    Depression    Exertional dyspnea    Gait disorder    GERD (gastroesophageal reflux disease)    History of DVT (deep vein thrombosis)    Hypertension    Insomnia    Migraines    Morbid obesity (HCC)    Multiple falls    Neuropathy    Neuropathy    Obesity    Urinary incontinence     Past Surgical History:  Procedure Laterality Date   ESSURE TUBAL LIGATION  2008   FINGER FRACTURE SURGERY      Social History:  reports that she has been smoking cigarettes. She has a 4.5 pack-year smoking history. She uses smokeless tobacco. She reports current alcohol use. She reports that she does not use drugs.  Allergies:  Allergies  Allergen Reactions   Olopatadine Hcl Swelling    Ophthalmic swelling    Medications Prior to Admission  Medication Sig Dispense Refill   APIXABAN  (ELIQUIS ) VTE STARTER PACK (10MG  AND  5MG ) Take as directed on package: start with two-5mg  tablets twice daily for 7 days. On day 8, switch to one-5mg  tablet twice daily. (Patient taking differently: one-5mg  tablet twice daily.) 1 each 0   cetirizine (ZYRTEC) 10 MG tablet Take 10 mg by mouth daily.     gabapentin  (NEURONTIN ) 300 MG capsule Take 1 capsule (300 mg) by mouth in the morning and take 2 capsules (600 mg) by mouth in the evening 90 capsule 11   lisinopril  (ZESTRIL ) 30 MG tablet Take 30 mg  by mouth daily.     methocarbamol (ROBAXIN) 750 MG tablet Take 750 mg by mouth every 8 (eight) hours as needed.     omeprazole (PRILOSEC) 40 MG capsule Take 40 mg by mouth daily.     thiamine  (VITAMIN B1) 100 MG tablet Take 1 tablet (100 mg total) by mouth daily. 30 tablet 11   traZODone (DESYREL) 100 MG tablet Take 100 mg by mouth at bedtime as needed.     fluticasone (FLONASE) 50 MCG/ACT nasal spray Place 2 sprays into both nostrils daily.     furosemide (LASIX) 40 MG tablet Take 40 mg by mouth daily. (Patient not taking: Reported on 05/08/2024)     ondansetron  (ZOFRAN ) 4 MG tablet Take 1 tablet (4 mg total) by mouth every 6 (six) hours. (Patient taking differently: Take 8 mg by mouth every 6 (six) hours as needed for vomiting or nausea.) 12 tablet 0   Potassium Chloride  40 MEQ/15ML (20%) SOLN daily. (Patient not taking: Reported on 05/08/2024)     WEGOVY 0.25 MG/0.5ML SOAJ Inject 0.25 mg into the skin once a week. (Patient not taking: Reported on 05/08/2024)       Physical Exam: Blood pressure 121/74, pulse 97, temperature 97.8 F (36.6 C), temperature source Oral, resp. rate 18, height 5\' 4"  (1.626 m), weight (!) 144.4 kg, SpO2 97%. General: Pleasant  laying on hospital bed, appears stated age, NAD. HEENT: head -normocephalic, atraumatic; Eyes: PERRLA, no conjunctival injection Neck- Trachea is midline CV- RRR, normal S1/S2, no M/R/G, no lower extremity edema  Pulm- breathing is non-labored ORA Abd- soft, TTP in the RUQ, otherwise nontender, soft umbilical hernia present, no peritonitis  GU- deferred  MSK- UE/LE symmetrical, no cyanosis, clubbing, or edema. Neuro- non-focal exam, numbness/tingling of hands and feet are present - pt confirms this is chronic Psych- Alert and Oriented x3 with appropriate affect Skin: warm and dry, no rashes or lesions   Results for orders placed or performed during the hospital encounter of 05/08/24 (from the past 48 hours)  Lipase, blood     Status: None    Collection Time: 05/08/24 11:38 AM  Result Value Ref Range   Lipase 25 11 - 51 U/L    Comment: Performed at [facility], [address] [place], [place] [address]  Comprehensive metabolic panel     Status: Abnormal   Collection Time: 05/08/24 11:38 AM  Result Value Ref Range   Sodium 141 135 - 145 mmol/L   Potassium 2.8 (L) 3.5 - 5.1 mmol/L   Chloride 102 98 - 111 mmol/L   CO2 30 22 - 32 mmol/L   Glucose, Bld 112 (H) 70 - 99 mg/dL    Comment: Glucose reference range applies only to samples taken after fasting for at least 8 hours.   BUN 5 (L) 6 - 20 mg/dL   Creatinine, Ser 6.04 0.44 - 1.00 mg/dL   Calcium 8.1 (L) 8.9 - 10.3 mg/dL   Total Protein 6.6  6.5 - 8.1 g/dL   Albumin 2.6 (L) 3.5 - 5.0 g/dL   AST 97 (H) 15 - 41 U/L   ALT 35 0 - 44 U/L   Alkaline Phosphatase 158 (H) 38 - 126 U/L   Total Bilirubin 0.6 0.0 - 1.2 mg/dL   GFR, Estimated >16 >10 mL/min    Comment: (NOTE) Calculated using the CKD-EPI Creatinine Equation (2021)    Anion gap 9 5 - 15    Comment: Performed at [facility], [address] [place], [place] [address]  CBC     Status: Abnormal   Collection Time: 05/08/24 11:38 AM  Result Value Ref Range   WBC 8.1 4.0 - 10.5 K/uL   RBC 3.12 (L) 3.87 - 5.11 MIL/uL   Hemoglobin 12.2 12.0 - 15.0 g/dL   HCT 40.9 (L) 81.1 - 91.4 %   MCV 114.4 (H) 80.0 - 100.0 fL   MCH 39.1 (H) 26.0 - 34.0 pg   MCHC 34.2 30.0 - 36.0 g/dL   RDW 78.2 (H) 95.6 - 21.3 %   Platelets 142 (L) 150 - 400 K/uL   nRBC 0.0 0.0 - 0.2 %    Comment: Performed at [facility], [address], [place], [place] [address]  Urinalysis, Routine w reflex microscopic -Urine, Clean Catch     Status: Abnormal   Collection Time: 05/08/24 11:38 AM  Result Value Ref Range   Color, Urine AMBER (A) YELLOW    Comment: BIOCHEMICALS MAY BE AFFECTED BY COLOR   APPearance CLOUDY (A) CLEAR   Specific Gravity, Urine 1.023 1.005 - 1.030   pH 6.0 5.0 - 8.0    Glucose, UA NEGATIVE NEGATIVE mg/dL   Hgb urine dipstick LARGE (A) NEGATIVE   Bilirubin Urine SMALL (A) NEGATIVE   Ketones, ur NEGATIVE NEGATIVE mg/dL   Protein, ur 846 (A) NEGATIVE mg/dL   Nitrite NEGATIVE NEGATIVE   Leukocytes,Ua SMALL (A) NEGATIVE   RBC / HPF >50 0 - 5 RBC/hpf   WBC, UA 21-50 0 - 5 WBC/hpf   Bacteria, UA MANY (A) NONE SEEN   Squamous Epithelial / HPF 21-50 0 - 5 /HPF   Mucus PRESENT    Hyaline Casts, UA PRESENT     Comment: Performed at [facility], [address] [place], [place] [address]  hCG, serum, qualitative     Status: None   Collection Time: 05/08/24 11:38 AM  Result Value Ref Range   Preg, Serum NEGATIVE NEGATIVE    Comment:        THE SENSITIVITY OF THIS METHODOLOGY IS >10 mIU/mL. Performed at [facility], [address], [place], [place] [address]   Magnesium     Status: None   Collection Time: 05/08/24 10:25 PM  Result Value Ref Range   Magnesium 1.7 1.7 - 2.4 mg/dL    Comment: Performed at [facility], [address] [place], [place] [address]  Comprehensive metabolic panel     Status: Abnormal   Collection Time: 05/09/24  4:01 AM  Result Value Ref Range   Sodium 142 135 - 145 mmol/L   Potassium 3.1 (L) 3.5 - 5.1 mmol/L   Chloride 106 98 - 111 mmol/L   CO2 28 22 - 32 mmol/L   Glucose, Bld 95 70 - 99 mg/dL    Comment: Glucose reference range applies only to samples taken after fasting for at least 8 hours.   BUN 6 6 - 20 mg/dL   Creatinine,  Ser 0.48 0.44 - 1.00 mg/dL   Calcium 7.9 (L) 8.9 - 10.3 mg/dL   Total Protein 5.4 (L) 6.5 - 8.1 g/dL   Albumin 2.1 (L) 3.5 - 5.0 g/dL   AST 67 (H) 15 - 41 U/L   ALT 27 0 - 44 U/L   Alkaline Phosphatase 117 38 - 126 U/L   Total Bilirubin 1.0 0.0 - 1.2 mg/dL   GFR, Estimated >82 >95 mL/min    Comment: (NOTE) Calculated using the CKD-EPI Creatinine Equation (2021)    Anion gap 8 5 - 15    Comment: Performed at [facility], [address] [place], [place] [address]  CBC     Status: Abnormal   Collection Time: 05/09/24  4:01 AM  Result Value Ref Range   WBC 6.0 4.0 - 10.5 K/uL   RBC 2.63 (L) 3.87 - 5.11 MIL/uL   Hemoglobin 10.4 (L) 12.0 - 15.0 g/dL   HCT 86.5 (L) 78.4 - 69.6 %   MCV 114.4 (H) 80.0 - 100.0 fL   MCH 39.5 (H) 26.0 - 34.0 pg   MCHC 34.6 30.0 - 36.0 g/dL   RDW 29.5 (H) 28.4 - 13.2 %   Platelets 121 (L) 150 - 400 K/uL   nRBC 0.0 0.0 - 0.2 %    Comment: Performed at [facility], [address] [place], [place] [address]   US  Abdomen Limited RUQ (LIVER/GB) Result Date: 05/08/2024 CLINICAL DATA:  151471 RUQ pain 151471. EXAM: ULTRASOUND ABDOMEN LIMITED RIGHT UPPER QUADRANT COMPARISON:  04/15/2024. FINDINGS: Gallbladder: Physiologically distended. Small volume gallstones and sludge noted. No abnormal wall thickening or pericholecystic free fluid. Sonographic Murphy's sign was negative as per the technologist. Common bile duct: Diameter: Up to 2.1 mm.  No intrahepatic bile duct dilation. Liver: There is poor sound beam penetration to the deep / posterior aspects of the liver as a result of increased hepatic echogenicity which reduces the sensitivity of ultrasound for the detection of focal masses. That being said, no focal mass is identified. Portal vein is patent on color Doppler imaging with normal direction of blood flow towards the liver. Other: None. IMPRESSION: 1. Cholelithiasis without acute cholecystitis. 2. Increased hepatic echogenicity, a nonspecific finding that is most commonly seen on the basis of steatosis in the absence of known liver disease. Electronically Signed   By: Beula Brunswick M.D.   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 Alk phos/bili are normal - recommend laparoscopic cholecystectomy, possible IOC for suspected cholecystitis.   The operative and non-operative management of cholecystitis  was discussed with the patient. Risks of surgery including bleeding, infection, damage to surrounding structures, conversion to open, drain placement, need  for additional procedures, prolonged hospital stay, as well as the risks of general anesthesia were discussed with the patient and she would like to proceed with surgery. Questions were welcomed and answered.   FEN - NPO, IVF VTE - SCD's ID - Rocephin on call to OR Admit - TRH service   Obesity Neuropathy Alcohol abuse Tobacco abuse   I reviewed nursing notes, hospitalist notes, last 24 h vitals and pain scores, last 48 h intake and output, last 24 h labs and trends, and last 24 h imaging results.  Charlott Converse, [facility] 05/09/2024, 12:29 PM Please see Amion for pager number during day hours 7:00am-4:30pm or 7:00am -11:30am on weekends

## 2024-05-09 NOTE — Transfer of Care (Signed)
 Immediate Anesthesia Transfer of Care Note  Patient: Sonya Hurst  Procedure(s) Performed: LAPAROSCOPIC CHOLECYSTECTOMY WITH INTRAOPERATIVE CHOLANGIOGRAM (Abdomen)  Patient Location: PACU  Anesthesia Type:General  Level of Consciousness: awake, alert , and oriented  Airway & Oxygen Therapy: Patient Spontanous Breathing and Patient connected to face mask oxygen  Post-op Assessment: Report given to RN and Post -op Vital signs reviewed and stable  Post vital signs: Reviewed and stable  Last Vitals:  Vitals Value Taken Time  BP    Temp    Pulse    Resp    SpO2      Last Pain:  Vitals:   05/09/24 1442  TempSrc:   PainSc: 0-No pain      Patients Stated Pain Goal: 5 (05/09/24 1439)  Complications: No notable events documented.

## 2024-05-09 NOTE — Anesthesia Postprocedure Evaluation (Signed)
 Anesthesia Post Note  Patient: Sonya Hurst  Procedure(s) Performed: LAPAROSCOPIC CHOLECYSTECTOMY WITH INTRAOPERATIVE CHOLANGIOGRAM (Abdomen)     Patient location during evaluation: PACU Anesthesia Type: General Level of consciousness: awake and alert Pain management: pain level controlled Vital Signs Assessment: post-procedure vital signs reviewed and stable Respiratory status: spontaneous breathing, nonlabored ventilation, respiratory function stable and patient connected to nasal cannula oxygen Cardiovascular status: blood pressure returned to baseline and stable Postop Assessment: no apparent nausea or vomiting Anesthetic complications: no   No notable events documented.  Last Vitals:  Vitals:   05/09/24 1734 05/09/24 1745  BP: 105/67 96/60  Pulse: 94 88  Resp: (!) 21 20  Temp: (!) 36.3 C   SpO2: 99% 97%    Last Pain:  Vitals:   05/09/24 1745  TempSrc:   PainSc: 4                  Lethaniel Rave

## 2024-05-09 NOTE — Progress Notes (Signed)
 [facility]  PROGRESS NOTE  Sonya Hurst NFA:[id number] DOB: 04-Feb-1981 DOA: 05/08/2024 PCP: Annabell Key, [place]  E, PA   Brief HPI:   43 y.o. 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  T   On further discussion, patient says that while she is compliant with her other medications, she often forgets to take her Eliquis  and has not taken it since Monday 5/19.  ER provider discussed with on-call general surgery, they request hospitalist admission and will see the patient in the morning.     Assessment/Plan:   Right upper quadrant abdominal pain-pain and nausea, intractable, patient is unable to tolerate p.o. diet.  Most likely this is due to symptomatic cholelithiasis, no evidence of acute cholecystitis. -Will consult general surgery, patient was supposed to have elective cholecystectomy as outpatient   Hypokalemia-likely due to recent GI losses from vomiting,  - Will replace potassium and follow BMP in am   History of DVT -has been on Eliquis  since April 2023.  Reports intermittent compliance, states she last took her Eliquis  on 5/19.  This medication has been discontinued   GERD-omeprazole   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, continue home gabapentin    Alcohol abuse-with previous history of early cirrhotic changes -Thiamine , folate, multivitamin -P.o. Ativan per CIWA protocol    Medications     enoxaparin (LOVENOX) injection  75 mg Subcutaneous Q24H   fluticasone  2 spray Each Nare Daily   folic acid  1 mg Oral Daily   furosemide  40 mg Oral Daily   gabapentin   300 mg Oral BID   lisinopril   30 mg Oral Daily   loratadine  10 mg Oral Daily   multivitamin with minerals  1 tablet Oral Daily   pantoprazole  40 mg Oral Daily    potassium chloride   40 mEq Oral Daily   thiamine   100 mg Oral Daily   Or   thiamine   100 mg Intravenous Daily     Data Reviewed:   CBG:  No results for input(s): "GLUCAP" in the last 168 hours.  SpO2: 100 %    Vitals:   05/09/24 0159 05/09/24 0432 05/09/24 0434 05/09/24 1005  BP: (!) 96/59 99/63 100/61 115/73  Pulse: 100 96 96 91  Resp: 18 17  18   Temp: 99.2 F (37.3 C) 98 F (36.7 C)  98.6 F (37 C)  TempSrc: Oral Oral  Oral  SpO2: 97% 94%  100%  Weight:      Height:          Data Reviewed:  Basic Metabolic Panel: Recent Labs  Lab 05/08/24 1138 05/08/24 2225 05/09/24 0401  NA 141  --  142  K 2.8*  --  3.1*  CL 102  --  106  CO2 30  --  28  GLUCOSE 112*  --  95  BUN 5*  --  6  CREATININE 0.62  --  0.48  CALCIUM 8.1*  --  7.9*  MG  --  1.7  --     CBC: Recent Labs  Lab 05/08/24 1138 05/09/24 0401  WBC 8.1 6.0  HGB 12.2 10.4*  HCT 35.7* 30.1*  MCV 114.4* 114.4*  PLT 142* 121*    LFT Recent Labs  Lab 05/08/24 1138 05/09/24 0401  AST 97* 67*  ALT 35 27  ALKPHOS 158*  117  BILITOT 0.6 1.0  PROT 6.6 5.4*  ALBUMIN 2.6* 2.1*     Antibiotics: Anti-infectives (From admission, onward)    None        DVT prophylaxis: Lovenox  Code Status: Full code  Family Communication:    CONSULTS    Subjective   Continues to have right upper quadrant pain.   Objective    Physical Examination:   General-appears in no acute distress Heart-S1-S2, regular, no murmur auscultated Lungs-clear to auscultation bilaterally, no wheezing or crackles auscultated Abdomen-soft, positive right upper quadrant tenderness to palpation Extremities-no edema in the lower extremities Neuro-alert, oriented x3, no focal deficit noted   Status is: Inpatient:             Sonya Hurst   [facility] If 7PM-7AM, please contact night-coverage at [email or web address], Office  [phone]   05/09/2024, 10:55 AM  LOS: 0 days

## 2024-05-09 NOTE — Op Note (Signed)
 05/09/2024  PATIENT:  Sonya Hurst  43 y.o. female  Patient Care Team: Annabell Key, [place]  E, PA as PCP - General (Internal Medicine) Ellene Gustin, MD as Consulting Physician (Neurology)  PRE-OPERATIVE DIAGNOSIS:    Acute on Chronic Calculus Cholecystitis Umbilical 3 x 2 cm hernia, incarcerated  POST-OPERATIVE DIAGNOSIS:   Acute on Chronic Calculus Cholecystitis Fatty steatohepatitis Umbilical 3 x 2 cm hernia, incarcerated  PROCEDURE:   Laparoscopic cholecystectomy with intraoperative cholangiogram (CPT code 04540) Core Liver Biopsy (CPT code 98119) Primary umbilical hernia repair  SURGEON:  Eddye Goodie, MD, FACS.  ASSISTANT:  (n/a)   ANESTHESIA:  General endotracheal intubation anesthesia (GETA) and Local & regional field block at incision(s) for perioperative & postoperative pain control provided with liposomal bupivacaine (Experel) 20mL mixed with 30mL of bupivicaine 0.25% with epinephrine  Estimated Blood Loss (EBL):   Total I/O In: 250 [IV Piggyback:250] Out: 20 [Blood:20].   (See anesthesia record)  Delay start of Pharmacological VTE agent (>24hrs) due to concerns of significant anemia, surgical blood loss, or risk of bleeding?:  Okay to resume full anticoagulation tomorrow morning if hemoglobin stable.  DRAINS: (None)  SPECIMEN:  Hernia sac (sent), Gallbladder, and Core needle liver biopsies  DISPOSITION OF SPECIMEN:  Pathology  COUNTS:  Sponge, needle, & instrument counts CORRECT at the conclusion of the case.      PLAN OF CARE: Admit to inpatient   PATIENT DISPOSITION:  PACU - hemodynamically stable.  INDICATION: 43 year old woman with nausea vomiting abdominal pain.  Had workup concerning for symptomatic gallstones.  Dr. Lanell Pinta with our group was planning elective resection when she had worsening discomfort and was admitted.  Other etiologies ruled out.  Cleared by medicine.  Off oral anticoagulation more than 48 hours.  Consultation made with surgery  earlier this morning.  I offered surgery:  The anatomy & physiology of hepatobiliary & pancreatic function was discussed.  The pathophysiology of gallbladder dysfunction was discussed.  Natural history risks without surgery was discussed.   I feel the risks of no intervention will lead to serious problems that outweigh the operative risks; therefore, I recommended cholecystectomy to remove the pathology.  I explained laparoscopic techniques with possible need for an open approach.  Probable cholangiogram to evaluate the bilary tract was explained as well.    Risks such as bleeding, infection, abscess, leak, injury to other organs, need for further treatment, heart attack, death, and other risks were discussed.  I noted a good likelihood this will help address the problem.  Possibility that this will not correct all abdominal symptoms was explained.  Goals of post-operative recovery were discussed as well.  We will work to minimize complications.  An educational handout further explaining the pathology and treatment options was given as well.  Questions were answered.  The patient expresses understanding & wishes to proceed with surgery.  OR FINDINGS: Edematous gallbladder with chronic thickening consistent with acute on chronic cholecystitis.  No necrosis or empyema.  Gallbladder full of thick bile with a lot of sandy stones.  Cholangiogram showing rather typical anatomy with no evidence of any choledocholithiasis, stricture, nor leak. Liver: Significant fatty change and thickening.  No macronodular cirrhosis.  Liver biopsies done to assess.  Umbilical hernia containing some omentum and falciform ligament.  Reduced.  Hernia sac excised.  3 x 2 cm defect.  Primary transverse repair done with interrupted #1 PDS suture.  DESCRIPTION:   Informed consent was confirmed.  The patient underwent general anaesthesia without difficulty.  The  patient was positioned appropriately.  VTE prevention in place.  The  patient's abdomen was clipped, prepped, & draped in a sterile fashion.  Surgical timeout confirmed our plan.  Peritoneal entry with a laparoscopic port was obtained using optical entry technique in the right upper abdomen as the patient was positioned in reverse Trendelenburg.  Entry was clean.  I induced carbon dioxide insufflation.  Camera inspection revealed no injury.  Extra ports were carefully placed under direct laparoscopic visualization, can care to reduce the omentum and placed the largest port through the periumbilical hernia..  I turned attention to the right upper quadrant.  The gallbladder fundus was elevated cephalad.  I used hook cautery to free the peritoneal coverings between the gallbladder and the liver on the posteriolateral and anteriomedial walls.   I used careful blunt and hook dissection to help get a good critical view of the cystic artery and cystic duct. I did further dissection to free allof the gallbladder off the liver bed to get a good critical view of the infundibulum and cystic duct.  I dissected out the cystic artery; and, after getting a good 360 view, ligated the anterior & posterior branches of the cystic artery close on the infundibulum using clips and cautery dissection.  During dissection there was breaks into the wall with spillage of molasses thick stones and sandy stones.  These were aspirated carefully to minimize spillage and contamination.  I skeletonized the narrow cystic duct.  I placed a clip on the infundibulum. I did a partial cystic duct-otomy and ensured patency. I placed a 5 [place] cholangiocatheter through a puncture site at the right subcostal ridge of the abdominal wall and directed it into the cystic duct.  We ran a cholangiogram with dilute radio-opaque contrast and continuous fluoroscopy. Contrast flowed from a side branch consistent with cystic duct cannulization. Contrast flowed up the common hepatic duct into the right and left intrahepatic  chains out to secondary radicals. Contrast flowed down the common bile duct easily across the normal ampulla into the duodenum.  No evidence of biliary dilatation, stricture, filling defect, nor contrast extravasation.  This was consistent with a normal cholangiogram.  I removed the cholangiocatheter. I placed clips on the cystic duct x4.  I completed cystic duct transection.  Placed the gallbladder inside an EcoSac.  Irrigated several liters to ensure all spilled bile and grit were aspirated and washed out safely.   Given concerns for cirrhosis on the CAT scan at least obvious steatohepatitis on the liver, I decided to proceed with needle biopsies.  Used a 14-gauge Tru-Cut needle and passed through the anterior liver lobe.  Did 4 passes to get adequate liver core specimens.  I assured hemostasis on the liver bed. I ensured hemostasis on the gallbladder fossa of the liver and elsewhere. I inspected the rest of the abdomen & detected no injury nor bleeding elsewhere.  I removed the gallbladder side EcoSac.  I worked to excise the umbilical stalk off the fascia to better expose the hernia.  Excised some fat and hernia sac until I had the edges of the defect.  I primarily closed the hernia transversely using #1 PDS interrupted stitches.  I ended up trimming the umbilical stalk and closing the base with running 4 Monocryl suture.  Tacked the umbilical stalk to the fascia using 0 Vicryl suture.  I closed the skin using 4-0 monocryl stitch.  The smaller port sites were closed using 4 Monocryl suture as well sterile dressings were applied. The  patient was extubated & arrived in the PACU in stable condition..  I do not think the patient needs additional antibiotics.  I do not leave a drain.  Minimal blood loss and hemostasis was good.  I think is reasonable for her to resume full anticoagulation tomorrow as long as hemoglobin does not significantly drop or other concerns..  Defer to primary service if they wish to  go ahead and resume her oral Eliquis  or continue Lovenox shots.  I had discussed postoperative care with the patient in the holding area.  I made attempt to reach the daughter Rhunette Cha at the given phone number.  Got no answer nor identification with voicemail.  We will try and contact and discuss with patient and family later.     Eddye Goodie, M.D., F.A.C.S. Gastrointestinal and Minimally Invasive Surgery [facility] [address] [place], [place] [address] [phone] Main / Paging  05/09/2024 5:18 PM

## 2024-05-09 NOTE — Plan of Care (Signed)

## 2024-05-09 NOTE — Discharge Instructions (Signed)
 ################################################################  LAPAROSCOPIC SURGERY: POST OP INSTRUCTIONS  ######################################################################  EAT Gradually transition to a high fiber diet with a fiber supplement over the next few weeks after discharge.  Start with a pureed / full liquid diet (see below)  WALK Walk an hour a day.  Control your pain to do that.    CONTROL PAIN Control pain so that you can walk, sleep, tolerate sneezing/coughing, go up/down stairs.  HAVE A BOWEL MOVEMENT DAILY Keep your bowels regular to avoid problems.  OK to try a laxative to override constipation.  OK to use an antidairrheal to slow down diarrhea.  Call if not better after 2 tries  CALL IF YOU HAVE PROBLEMS/CONCERNS Call if you are still struggling despite following these instructions. Call if you have concerns not answered by these instructions  ######################################################################    DIET: Follow a light bland diet & liquids the first 24 hours after arrival home, such as soup, liquids, starches, etc.  Be sure to drink plenty of fluids.  Quickly advance to a usual solid diet within a few days.  Avoid fast food or heavy meals as your are more likely to get nauseated or have irregular bowels.  A low-fat, high-fiber diet for the rest of your life is ideal.  Take your usually prescribed home medications unless otherwise directed. Blood thinners:  You can restart any strong blood thinners after the second postoperative day  for example: COUMADIN (warfarin), XERELTO (rivaroxaban), ELIQUIS  (apixaban ), PLAVIX (clopidigrel), BRILINTA (ticagrelor), EFFIENT (prasugrel), PRADAXA (dabigatran), etc  Continue aspirin  before & after surgery..     Some oozing/bleeding the first 1-2 weeks is common but should taper down & be small volume.    If you are passing many large clots or having uncontrolling bleeding, call your surgeon  PAIN  CONTROL: Pain is best controlled by a usual combination of three different methods TOGETHER: Ice/Heat Over the counter pain medication Prescription pain medication Most patients will experience some swelling and bruising around the incisions.  Ice packs or heating pads (30-60 minutes up to 6 times a day) will help. Use ice for the first few days to help decrease swelling and bruising, then switch to heat to help relax tight/sore spots and speed recovery.  Some people prefer to use ice alone, heat alone, alternating between ice & heat.  Experiment to what works for you.  Swelling and bruising can take several weeks to resolve.   It is helpful to take an over-the-counter pain medication regularly for the first few weeks.  Choose one of the following that works best for you: Naproxen (Aleve, etc)  Two 220mg  tabs twice a day Ibuprofen  (Advil , etc) Three 200mg  tabs four times a day (every meal & bedtime) Acetaminophen (Tylenol, etc) 500-650mg  four times a day (every meal & bedtime) A  prescription for pain medication (such as oxycodone, hydrocodone, tramadol , gabapentin , methocarbamol, etc) should be given to you upon discharge.  Take your pain medication as prescribed.  If you are having problems/concerns with the prescription medicine (does not control pain, nausea, vomiting, rash, itching, etc), please call us  [phone] to see if we need to switch you to a different pain medicine that will work better for you and/or control your side effect better. If you need a refill on your pain medication, please give us  48 hour notice.  contact your pharmacy.  They will contact our office to request authorization. Prescriptions will not be filled after 5 pm or on week-ends  AVOID GETTING CONSTIPATED.   a.  Between the surgery and the pain medications, it is common to experience some constipation.  b.  Drink plenty of liquids c   ake a fiber supplement 2 times day (such as Metamucil, Citrucel, FiberCon,  MiraLax, etc) to have a bowel movement every day. d.  If you have not had a BM by 2 days after surgery: -drink liquids only until you have a bowel movement - take MiraLAX 2 doses every 2 hours until you have a bowel movement   Watch out for diarrhea.   If you have many loose bowel movements, simplify your diet to bland foods & liquids for a few days.   Stop any stool softeners and decrease your fiber supplement.   Switching to mild anti-diarrheal medications (Kayopectate, Pepto Bismol) can help.   If this worsens or does not improve, please call us .  Wash / shower every day.  You may shower over the dressings as they are waterproof.  Continue to shower over incision(s) after the dressing is off.  It is good for closed incisions and even open wounds to be washed every day.  Shower every day.  Short baths are fine.  Wash the incisions and wounds clean with soap & water.    You may leave closed incisions open to air if it is dry.   You may cover the incision with clean gauze & replace it after your daily shower for comfort.  TEGADERM:  You have clear gauze band-aid dressings over your closed incision(s).  Remove the dressings 2 days after surgery = 5/25.    ACTIVITIES as tolerated:   You may resume regular (light) daily activities beginning the next day--such as daily self-care, walking, climbing stairs--gradually increasing activities as tolerated.  If you can walk 30 minutes without difficulty, it is safe to try more intense activity such as jogging, treadmill, bicycling, low-impact aerobics, swimming, etc. Save the most intensive and strenuous activity for last such as sit-ups, heavy lifting, contact sports, etc  Refrain from any heavy lifting or straining until you are off narcotics for pain control.   DO NOT PUSH THROUGH PAIN.  Let pain be your guide: If it hurts to do something, don't do it.  Pain is your body warning you to avoid that activity for another week until the pain goes  down. You may drive when you are no longer taking prescription pain medication, you can comfortably wear a seatbelt, and you can safely maneuver your car and apply brakes. You may have sexual intercourse when it is comfortable.  FOLLOW UP in our office Please call [facility] at [phone] to set up an appointment to see your surgeon in the office for a follow-up appointment approximately 2-3 weeks after your surgery. Make sure that you call for this appointment the day you arrive home to insure a convenient appointment time.  10. IF YOU HAVE DISABILITY OR FAMILY LEAVE FORMS, BRING THEM TO THE OFFICE FOR PROCESSING.  DO NOT GIVE THEM TO YOUR DOCTOR.   WHEN TO CALL US  [phone]: Poor pain control Reactions / problems with new medications (rash/itching, nausea, etc)  Fever over 101.5 F (38.5 C) Inability to urinate Nausea and/or vomiting Worsening swelling or bruising Continued bleeding from incision. Increased pain, redness, or drainage from the incision   The clinic staff is available to answer your questions during regular business hours (8:30am-5pm).  Please don't hesitate to call and ask to speak to one of our nurses for clinical concerns.   If you have a  medical emergency, go to the nearest emergency room or call 911.  A surgeon from [facility] is always on call at the [facility], [place] [address], [address], [place], [place]  [address] ? MAIN: [phone] ? TOLL FREE: [phone] ?  FAX [phone] [email or web address]  ##############################################################

## 2024-05-09 NOTE — Anesthesia Procedure Notes (Signed)
 Procedure Name: Intubation Date/Time: 05/09/2024 3:46 PM  Performed by: Inez Manger, CRNAPre-anesthesia Checklist: Patient identified, Emergency Drugs available, Suction available, Patient being monitored and Timeout performed Patient Re-evaluated:Patient Re-evaluated prior to induction Oxygen Delivery Method: Circle system utilized Preoxygenation: Pre-oxygenation with 100% oxygen Induction Type: IV induction Ventilation: Mask ventilation without difficulty Laryngoscope Size: Miller and 2 Grade View: Grade I Tube type: Oral Tube size: 7.0 mm Number of attempts: 1 Airway Equipment and Method: Stylet Placement Confirmation: ETT inserted through vocal cords under direct vision, positive ETCO2 and breath sounds checked- equal and bilateral Secured at: 21 cm Tube secured with: Tape Dental Injury: Teeth and Oropharynx as per pre-operative assessment  Comments: Smooth brief atraumatic dentition unchanged

## 2024-05-09 NOTE — Anesthesia Preprocedure Evaluation (Signed)
 Anesthesia Evaluation  Patient identified by MRN, date of birth, ID band Patient awake    Reviewed: Allergy & Precautions, H&P , NPO status , Patient's Chart, lab work & pertinent test results  Airway Mallampati: II  TM Distance: >3 FB Neck ROM: Full    Dental no notable dental hx.    Pulmonary neg pulmonary ROS, Current Smoker   Pulmonary exam normal breath sounds clear to auscultation       Cardiovascular hypertension, Normal cardiovascular exam Rhythm:Regular Rate:Normal     Neuro/Psych  Headaches PSYCHIATRIC DISORDERS  Depression       GI/Hepatic ,GERD  ,,(+)     substance abuse  alcohol use  Endo/Other  negative endocrine ROS    Renal/GU negative Renal ROS  negative genitourinary   Musculoskeletal negative musculoskeletal ROS (+)    Abdominal  (+) + obese  Peds negative pediatric ROS (+)  Hematology Hx of DVT   Anesthesia Other Findings   Reproductive/Obstetrics negative OB ROS                              Anesthesia Physical Anesthesia Plan  ASA: 3  Anesthesia Plan: General   Post-op Pain Management: Tylenol PO (pre-op)*   Induction: Intravenous  PONV Risk Score and Plan: 3 and Ondansetron , Dexamethasone , Midazolam and Treatment may vary due to age or medical condition  Airway Management Planned: Oral ETT  Additional Equipment: None  Intra-op Plan:   Post-operative Plan: Extubation in OR  Informed Consent: I have reviewed the patients History and Physical, chart, labs and discussed the procedure including the risks, benefits and alternatives for the proposed anesthesia with the patient or authorized representative who has indicated his/her understanding and acceptance.     Dental advisory given  Plan Discussed with: CRNA  Anesthesia Plan Comments:          Anesthesia Quick Evaluation

## 2024-05-10 ENCOUNTER — Encounter (HOSPITAL_COMMUNITY): Payer: Self-pay | Admitting: Surgery

## 2024-05-10 DIAGNOSIS — K420 Umbilical hernia with obstruction, without gangrene: Secondary | ICD-10-CM | POA: Diagnosis not present

## 2024-05-10 DIAGNOSIS — R1110 Vomiting, unspecified: Secondary | ICD-10-CM | POA: Diagnosis not present

## 2024-05-10 DIAGNOSIS — K8020 Calculus of gallbladder without cholecystitis without obstruction: Secondary | ICD-10-CM | POA: Diagnosis not present

## 2024-05-10 DIAGNOSIS — E876 Hypokalemia: Secondary | ICD-10-CM | POA: Diagnosis not present

## 2024-05-10 LAB — COMPREHENSIVE METABOLIC PANEL WITH GFR
ALT: 55 U/L — ABNORMAL HIGH (ref 0–44)
AST: 184 U/L — ABNORMAL HIGH (ref 15–41)
Albumin: 2.4 g/dL — ABNORMAL LOW (ref 3.5–5.0)
Alkaline Phosphatase: 133 U/L — ABNORMAL HIGH (ref 38–126)
Anion gap: 10 (ref 5–15)
BUN: 7 mg/dL (ref 6–20)
CO2: 25 mmol/L (ref 22–32)
Calcium: 8.2 mg/dL — ABNORMAL LOW (ref 8.9–10.3)
Chloride: 105 mmol/L (ref 98–111)
Creatinine, Ser: 0.83 mg/dL (ref 0.44–1.00)
GFR, Estimated: >60 mL/min (ref >60)
Glucose, Bld: 101 mg/dL — ABNORMAL HIGH (ref 70–99)
Potassium: 3.6 mmol/L (ref 3.5–5.1)
Sodium: 140 mmol/L (ref 135–145)
Total Bilirubin: 0.7 mg/dL (ref 0.0–1.2)
Total Protein: 6 g/dL — ABNORMAL LOW (ref 6.5–8.1)

## 2024-05-10 LAB — CBC
HCT: 34.5 % — ABNORMAL LOW (ref 36.0–46.0)
Hemoglobin: 11.3 g/dL — ABNORMAL LOW (ref 12.0–15.0)
MCH: 38.6 pg — ABNORMAL HIGH (ref 26.0–34.0)
MCHC: 32.8 g/dL (ref 30.0–36.0)
MCV: 117.7 fL — ABNORMAL HIGH (ref 80.0–100.0)
Platelets: 138 10*3/uL — ABNORMAL LOW (ref 150–400)
RBC: 2.93 MIL/uL — ABNORMAL LOW (ref 3.87–5.11)
RDW: 15.9 % — ABNORMAL HIGH (ref 11.5–15.5)
WBC: 12.8 10*3/uL — ABNORMAL HIGH (ref 4.0–10.5)
nRBC: 0 % (ref 0.0–0.2)

## 2024-05-10 LAB — PROTIME-INR
INR: 1 (ref 0.8–1.2)
Prothrombin Time: 13.3 s (ref 11.4–15.2)

## 2024-05-10 MED ORDER — METHOCARBAMOL 750 MG PO TABS: 750.0000 mg | ORAL_TABLET | Freq: Three times a day (TID) | ORAL | 0 refills | Status: AC | PRN

## 2024-05-10 NOTE — Progress Notes (Signed)
 Discharge instructions read and reviewed. Pt verbalized understanding. Pt escorted to main entrance to friends personal vehicle.

## 2024-05-10 NOTE — Progress Notes (Signed)
 1 Day Post-Op   Subjective/Chief Complaint: Doing well sore    Objective: Vital signs in last 24 hours: Temp:  [97.4 F (36.3 C)-98.6 F (37 C)] 97.6 F (36.4 C) (05/23 1934) Pulse Rate:  [72-97] 72 (05/23 1934) Resp:  [16-21] 16 (05/23 1934) BP: (96-122)/(60-87) 122/87 (05/23 1934) SpO2:  [78 %-100 %] 100 % (05/23 1945) Last BM Date : 05/09/24  Intake/Output from previous day: 05/23 0701 - 05/24 0700 In: 1650 [P.O.:600; I.V.:800; IV Piggyback:250] Out: 360 [Urine:340; Blood:20] Intake/Output this shift: No intake/output data recorded.  Abdomen port sites CDI sore no peritonitis   Lab Results:  Recent Labs    05/09/24 0401 05/10/24 0508  WBC 6.0 12.8*  HGB 10.4* 11.3*  HCT 30.1* 34.5*  PLT 121* 138*   BMET Recent Labs    05/09/24 0401 05/10/24 0508  NA 142 140  K 3.1* 3.6  CL 106 105  CO2 28 25  GLUCOSE 95 101*  BUN 6 7  CREATININE 0.48 0.83  CALCIUM 7.9* 8.2*   PT/INR Recent Labs    05/10/24 0508  LABPROT 13.3  INR 1.0   ABG No results for input(s): "PHART", "HCO3" in the last 72 hours.  Invalid input(s): "PCO2", "PO2"  Studies/Results: DG Cholangiogram Operative Result Date: 05/09/2024 CLINICAL DATA:  Surgery EXAM: INTRAOPERATIVE CHOLANGIOGRAM TECHNIQUE: Cholangiographic images from the C-arm fluoroscopic device were submitted for interpretation post-operatively. Please see the procedural report for the amount of contrast and the fluoroscopy time utilized. FLUOROSCOPY: Radiation Exposure Index (as provided by the fluoroscopic device): 6.2935 mGy Kerma fluoroscopy time of 8 seconds COMPARISON:  Ultrasound 05/08/2024 FINDINGS: Single static image and cine images are obtained during intraoperative cholangiogram. Injection through cystic duct remnant post cholecystectomy demonstrates normal caliber common bile duct and intrahepatic ducts. No filling defects. Free flow of contrast into the duodenum. IMPRESSION: Negative intraoperative cholangiogram.  Electronically Signed   By: Esmeralda Hedge M.D.   On: 05/09/2024 20:27   US  Abdomen Limited RUQ (LIVER/GB) Result Date: 05/08/2024 CLINICAL DATA:  151471 RUQ pain 151471. EXAM: ULTRASOUND ABDOMEN LIMITED RIGHT UPPER QUADRANT COMPARISON:  04/15/2024. FINDINGS: Gallbladder: Physiologically distended. Small volume gallstones and sludge noted. No abnormal wall thickening or pericholecystic free fluid. Sonographic Murphy's sign was negative as per the technologist. Common bile duct: Diameter: Up to 2.1 mm.  No intrahepatic bile duct dilation. Liver: There is poor sound beam penetration to the deep / posterior aspects of the liver as a result of increased hepatic echogenicity which reduces the sensitivity of ultrasound for the detection of focal masses. That being said, no focal mass is identified. Portal vein is patent on color Doppler imaging with normal direction of blood flow towards the liver. Other: None. IMPRESSION: 1. Cholelithiasis without acute cholecystitis. 2. Increased hepatic echogenicity, a nonspecific finding that is most commonly seen on the basis of steatosis in the absence of known liver disease. Electronically Signed   By: Beula Brunswick M.D.   On: 05/08/2024 13:41    Anti-infectives: Anti-infectives (From admission, onward)    Start     Dose/Rate Route Frequency Ordered Stop   05/10/24 0600  cefTRIAXone (ROCEPHIN) 2 g in sodium chloride  0.9 % 100 mL IVPB       Note to Pharmacy: Pharmacy may adjust dosing strength / duration / interval for maximal efficacy   2 g 200 mL/hr over 30 Minutes Intravenous On call to O.R. 05/09/24 1320 05/10/24 0519   05/09/24 1600  ceFAZolin (ANCEF) IVPB 3g/150 mL premix  3 g 300 mL/hr over 30 Minutes Intravenous On call to O.R. 05/09/24 1356 05/09/24 1627       Assessment/Plan: s/p Procedure(s) with comments: LAPAROSCOPIC CHOLECYSTECTOMY WITH INTRAOPERATIVE CHOLANGIOGRAM (N/A) - Possible intraoperative cholangiogram Stable for discharge from  surgery standpoint  Advance diet  Instructions in chart   LOS: 0 days    Sonya Hurst 05/10/2024

## 2024-05-10 NOTE — TOC Progression Note (Signed)
 Transition of Care [facility]) - Progression Note    Patient Details  Name: Sonya Hurst MRN: [id number] Date of Birth: [birth year]  [facility]) CM/SW Contact  Katrine Parody, LCSW Phone Number: 05/10/2024, 1:33 PM  Clinical Narrative:    CSW visited pt at bedside,talked about substance use education and resources.  Pt interested in resources.  CSW advised information will be added to her AVS.  Pt pleasant and agreeable.  TOC signing off, consult complete.       Barriers to Discharge: No Barriers Identified  Expected Discharge Plan and Services         Expected Discharge Date: 05/10/24                                     Social Determinants of Health (SDOH) Interventions SDOH Screenings   Food Insecurity: No Food Insecurity (05/08/2024)  Housing: Low Risk  (05/08/2024)  Transportation Needs: No Transportation Needs (05/08/2024)  Utilities: Not At Risk (05/08/2024)  Social Connections: Unknown (04/21/2022)   [facility]  Tobacco Use: High Risk (05/09/2024)    Readmission Risk Interventions     No data to display

## 2024-05-10 NOTE — Plan of Care (Signed)
  Problem: Health Behavior/Discharge Planning: Goal: Ability to manage health-related needs will improve Outcome: Progressing   Problem: Clinical Measurements: Goal: Will remain free from infection Outcome: Progressing   Problem: Pain Managment: Goal: General experience of comfort will improve and/or be controlled Outcome: Progressing

## 2024-05-10 NOTE — Discharge Summary (Addendum)
 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  Patient is no longer taking progesterone so Eliquis  was stopped in the hospital. Follow-up with PCP to discuss need for further anticoagulation. Check LFTs in 1 week as outpatient  Discharge Diagnoses: Principal Problem:   Intractable vomiting  Resolved Problems:   * No resolved hospital problems. *  Hospital Course:  43 y.o. 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  T   On further discussion, patient says that while she is compliant with her other medications, she often forgets to take her Eliquis  and has not taken it since Monday 5/19.  ER provider discussed with on-call general surgery, they request hospitalist admission and will see the patient in the morning.   Assessment and Plan:  Right upper quadrant abdominal pain-pain and nausea, intractable, patient is unable to tolerate p.o. diet.  Most likely this is due to symptomatic cholelithiasis, no evidence of acute cholecystitis. - General Surgery was consulted, patient underwent laparoscopic cholecystectomy with intraoperative cholangiogram -General Surgery has signed off, patient will be discharged home, follow-up with general surgery as outpatient.   Hypokalemia -replete   History of DVT -has been on Eliquis  since April 2023.  Reports intermittent compliance, states she last took her Eliquis  on 5/19.   This medication has been discontinued -She is no longer taking progesterone hormone therapy. -Patient to follow-up with PCP for further recommendations.      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, continue home gabapentin    Alcohol abuse-with previous history of early cirrhotic changes -Thiamine   Transaminitis - Mild elevation of AST, ALT and alk phos - After cholecystectomy - Check LFTs as outpatient in 1 week      Consultants: General Surgery Procedures performed:  Disposition: Home Diet recommendation:  Discharge Diet Orders (From admission, onward)     Start     Ordered   05/10/24 0000  Diet - low sodium heart healthy        05/10/24 1313   05/09/24 0000  Diet - low sodium heart healthy       Comments: Start with a bland diet such as soups, liquids, starchy foods, low fat foods, etc. the first few days at home. Gradually advance to a solid, low-fat, high fiber diet by the end of the first week at home.   Add a fiber supplement to your diet (Metamucil, etc) If you feel full, bloated, or constipated, stay on a full liquid or pureed/blenderized diet for a few days until you feel better and are no longer constipated.   05/09/24 1717           Regular diet DISCHARGE MEDICATION: Allergies as of 05/10/2024       Reactions   Olopatadine Hcl Swelling   Ophthalmic swelling        Medication List     STOP taking these medications    Apixaban  Starter Pack (10mg  and 5mg ) Commonly known as:  ELIQUIS  STARTER PACK       TAKE these medications    cetirizine 10 MG tablet Commonly known as: ZYRTEC Take 10 mg by mouth daily.   fluticasone 50 MCG/ACT nasal spray Commonly known as: FLONASE Place 2 sprays into both nostrils daily.   furosemide 40 MG tablet Commonly known as: LASIX Take 40 mg by mouth daily.   gabapentin  300 MG capsule Commonly known as: NEURONTIN  Take 1 capsule (300 mg) by mouth in the morning and take 2  capsules (600 mg) by mouth in the evening   lisinopril  30 MG tablet Commonly known as: ZESTRIL  Take 30 mg by mouth daily.   methocarbamol 750 MG tablet Commonly known as: ROBAXIN Take 1 tablet (750 mg total) by mouth every 8 (eight) hours as needed.   omeprazole 40 MG capsule Commonly known as: PRILOSEC Take 40 mg by mouth daily.   ondansetron  4 MG tablet Commonly known as: ZOFRAN  Take 1 tablet (4 mg total) by mouth every 6 (six) hours.   oxyCODONE-acetaminophen 5-325 MG tablet Commonly known as: Percocet Take 1 tablet by mouth every 6 (six) hours as needed for severe pain (pain score 7-10) or moderate pain (pain score 4-6). Can increase to 2 pills at a time as needed   Potassium Chloride  40 MEQ/15ML (20%) Soln daily.   thiamine  100 MG tablet Commonly known as: VITAMIN B1 Take 1 tablet (100 mg total) by mouth daily.   traZODone 100 MG tablet Commonly known as: DESYREL Take 100 mg by mouth at bedtime as needed.   Wegovy 0.25 MG/0.5ML Soaj Generic drug: Semaglutide-Weight Management Inject 0.25 mg into the skin once a week.               Discharge Care Instructions  (From admission, onward)           Start     Ordered   05/09/24 0000  Discharge wound care:       Comments: It is good for closed incisions and even open wounds to be washed every day.  Shower every day.  Short baths are fine.  Wash the incisions and wounds clean with soap & water.    You may leave closed incisions open to air if it is dry.   You may cover the incision with clean gauze & replace it after your daily shower for comfort.  TEGADERM:  You have clear gauze band-aid dressings over your closed incision(s).  Remove the dressings 2 days after surgery.   05/09/24 1717            Follow-up Information     [facility] Schedule an appointment as soon as possible for a visit in 3 week(s).   Specialty: General Surgery Why: To follow up after your operation Contact  information: [address] [place]  [address] [phone]        Annabell Key, [place]  E, PA Follow up in 1 week(s).   Specialty: Internal Medicine Why: Discussed with PCP regarding taking Eliquis .  Eliquis  has been discontinued in the hospital. Contact information: [address] [place] [address] [phone]                Discharge Exam: Cleavon Curls Weights   05/08/24 1808  Weight: (!) 144.4 kg   General-appears in no acute distress Heart-S1-S2, regular, no murmur auscultated Lungs-clear to auscultation bilaterally, no wheezing or crackles auscultated Abdomen-soft, nontender, no organomegaly Extremities-no edema in the lower extremities Neuro-alert, oriented x3, no  focal deficit noted  Condition at discharge: good  The results of significant diagnostics from this hospitalization (including imaging, microbiology, ancillary and laboratory) are listed below for reference.   Imaging Studies: DG Cholangiogram Operative Result Date: 05/09/2024 CLINICAL DATA:  Surgery EXAM: INTRAOPERATIVE CHOLANGIOGRAM TECHNIQUE: Cholangiographic images from the C-arm fluoroscopic device were submitted for interpretation post-operatively. Please see the procedural report for the amount of contrast and the fluoroscopy time utilized. FLUOROSCOPY: Radiation Exposure Index (as provided by the fluoroscopic device): 6.2935 mGy Kerma fluoroscopy time of 8 seconds COMPARISON:  Ultrasound 05/08/2024 FINDINGS: Single static image and cine images are obtained during intraoperative cholangiogram. Injection through cystic duct remnant post cholecystectomy demonstrates normal caliber common bile duct and intrahepatic ducts. No filling defects. Free flow of contrast into the duodenum. IMPRESSION: Negative intraoperative cholangiogram. Electronically Signed   By: Esmeralda Hedge M.D.   On: 05/09/2024 20:27   US  Abdomen Limited RUQ (LIVER/GB) Result Date:  05/08/2024 CLINICAL DATA:  151471 RUQ pain 151471. EXAM: ULTRASOUND ABDOMEN LIMITED RIGHT UPPER QUADRANT COMPARISON:  04/15/2024. FINDINGS: Gallbladder: Physiologically distended. Small volume gallstones and sludge noted. No abnormal wall thickening or pericholecystic free fluid. Sonographic Murphy's sign was negative as per the technologist. Common bile duct: Diameter: Up to 2.1 mm.  No intrahepatic bile duct dilation. Liver: There is poor sound beam penetration to the deep / posterior aspects of the liver as a result of increased hepatic echogenicity which reduces the sensitivity of ultrasound for the detection of focal masses. That being said, no focal mass is identified. Portal vein is patent on color Doppler imaging with normal direction of blood flow towards the liver. Other: None. IMPRESSION: 1. Cholelithiasis without acute cholecystitis. 2. Increased hepatic echogenicity, a nonspecific finding that is most commonly seen on the basis of steatosis in the absence of known liver disease. Electronically Signed   By: Beula Brunswick M.D.   On: 05/08/2024 13:41   CT ABDOMEN PELVIS W CONTRAST Result Date: 04/15/2024 CLINICAL DATA:  Acute abdominal pain. Abnormal liver on recent ultrasound. EXAM: CT ABDOMEN AND PELVIS WITH CONTRAST TECHNIQUE: Multidetector CT imaging of the abdomen and pelvis was performed using the standard protocol following bolus administration of intravenous contrast. RADIATION DOSE REDUCTION: This exam was performed according to the departmental dose-optimization program which includes automated exposure control, adjustment of the mA and/or kV according to patient size and/or use of iterative reconstruction technique. CONTRAST:  OMNIPAQUE  IOHEXOL  300 MG/ML  SOLN COMPARISON:  Ultrasound of 04/15/2024, and CT on 01/03/2019 FINDINGS: Lower Chest: No acute findings. Hepatobiliary: Moderate diffuse hepatic steatosis is noted. Enlarged caudate lobe and mild capsular nodularity in the right  hepatic lobe raise suspicion for cirrhosis. No hepatic masses are identified. Gallbladder is unremarkable. No evidence of biliary ductal dilatation. Pancreas:  No mass or inflammatory changes. Spleen: Within normal limits in size and appearance. Adrenals/Urinary Tract: No suspicious masses identified. No evidence of ureteral calculi or hydronephrosis. Stomach/Bowel: No evidence of obstruction, inflammatory process or abnormal fluid collections. Fatty infiltration of submucosa noted in the right colon, without signs of colitis or other significant abnormality. Vascular/Lymphatic: No pathologically enlarged lymph nodes. No acute vascular findings. Reproductive: No mass or other significant abnormality. Essure micro-inserts are again seen. Other:  Stable small fat-containing umbilical hernia. Musculoskeletal:  No suspicious bone lesions identified. IMPRESSION: Moderate hepatic steatosis. Findings suspicious for early cirrhosis. Recommend correlation with liver function tests and hepatic serology. Stable small fat-containing umbilical hernia. Electronically Signed   By: Marlyce Sine M.D.   On: 04/15/2024 13:53  US  Abdomen Limited RUQ (LIVER/GB) Result Date: 04/15/2024 CLINICAL DATA:  Upper abdominal pain EXAM: ULTRASOUND ABDOMEN LIMITED RIGHT UPPER QUADRANT COMPARISON:  Ultrasound 11/09/2021. FINDINGS: Gallbladder: Distended gallbladder. Some layering stones. Also some non dependent lobular areas under 5 mm consistent with small polyps. No wall thickening or adjacent fluid. Common bile duct: Diameter: 4 mm Liver: Diffusely echogenic hepatic parenchyma consistent with fatty liver infiltration. With this level of echogenicity evaluation for underlying mass lesion is limited and if needed follow-up contrast CT or MRI as clinically appropriate. Portal vein is patent on color Doppler imaging with normal direction of blood flow towards the liver. Other: None. IMPRESSION: Gallstones. Gallbladder polyp. No ductal  dilatation. No further sonographic evidence of acute cholecystitis. Fatty liver infiltration. Electronically Signed   By: Adrianna Horde M.D.   On: 04/15/2024 12:15    Microbiology: No results found for this or any previous visit.  Labs: CBC: Recent Labs  Lab 05/08/24 1138 05/09/24 0401 05/10/24 0508  WBC 8.1 6.0 12.8*  HGB 12.2 10.4* 11.3*  HCT 35.7* 30.1* 34.5*  MCV 114.4* 114.4* 117.7*  PLT 142* 121* 138*   Basic Metabolic Panel: Recent Labs  Lab 05/08/24 1138 05/08/24 2225 05/09/24 0401 05/10/24 0508  NA 141  --  142 140  K 2.8*  --  3.1* 3.6  CL 102  --  106 105  CO2 30  --  28 25  GLUCOSE 112*  --  95 101*  BUN 5*  --  6 7  CREATININE 0.62  --  0.48 0.83  CALCIUM 8.1*  --  7.9* 8.2*  MG  --  1.7  --   --    Liver Function Tests: Recent Labs  Lab 05/08/24 1138 05/09/24 0401 05/10/24 0508  AST 97* 67* 184*  ALT 35 27 55*  ALKPHOS 158* 117 133*  BILITOT 0.6 1.0 0.7  PROT 6.6 5.4* 6.0*  ALBUMIN 2.6* 2.1* 2.4*   CBG: No results for input(s): "GLUCAP" in the last 168 hours.  Discharge time spent: greater than 30 minutes.  Signed: Ozell Blunt, MD [facility] 05/10/2024

## 2024-05-14 LAB — SURGICAL PATHOLOGY

## 2024-05-27 ENCOUNTER — Encounter (HOSPITAL_COMMUNITY): Payer: Self-pay | Admitting: Gastroenterology

## 2024-05-27 ENCOUNTER — Encounter (HOSPITAL_COMMUNITY): Payer: Self-pay | Admitting: Physician Assistant

## 2024-05-27 NOTE — Progress Notes (Signed)
 Patient scheduled for endo procedure next Tues. 6/17. Upon anesthesia review of her case, due to pending cardiac workup and need for echo they are requiring echo be done prior to procedure. Left message for Dr Marjie Sieve nurse Sherri 6/10. Sherri returned my call 6/11 and received the message, will cancel her and let her know.

## 2024-06-03 ENCOUNTER — Ambulatory Visit (HOSPITAL_COMMUNITY): Admission: RE | Admit: 2024-06-03 | Source: Home / Self Care | Admitting: Gastroenterology

## 2024-06-03 SURGERY — COLONOSCOPY
Anesthesia: Monitor Anesthesia Care

## 2024-06-04 ENCOUNTER — Ambulatory Visit: Payer: Self-pay | Admitting: Cardiology

## 2024-06-04 ENCOUNTER — Ambulatory Visit (HOSPITAL_COMMUNITY)
Admission: RE | Admit: 2024-06-04 | Discharge: 2024-06-04 | Disposition: A | Discharge status: Home / Self Care | Source: Ambulatory Visit | Attending: Cardiology | Admitting: Cardiology

## 2024-06-04 DIAGNOSIS — R0609 Other forms of dyspnea: Secondary | ICD-10-CM

## 2024-06-04 LAB — ECHOCARDIOGRAM COMPLETE
Area-P 1/2: 7.09 cm2
S' Lateral: 2.5 cm

## 2024-06-06 ENCOUNTER — Other Ambulatory Visit: Payer: Self-pay

## 2024-06-06 DIAGNOSIS — R0609 Other forms of dyspnea: Secondary | ICD-10-CM

## 2024-06-06 NOTE — Telephone Encounter (Signed)
 Pt returning call to a nurse

## 2024-06-11 NOTE — Progress Notes (Deleted)
 I saw Sonya Hurst in neurology clinic on 06/19/24 in follow up for neuropathy.  HPI: Sonya Hurst is a 43 y.o. year old female with a history of Sjogren's syndrome, HTN, current smoker, obesity, DVT (on eliquis ), and EtOH abuse who we last saw on 12/21/23.  To briefly review: Initial consult (09/14/22): Symptoms have been present since 05/23/22. She woke up and her legs and hands felt tight with sharp pains in her feet. She numbness was in her legs below the knee and in her hands. She was having trouble walking and was falling. When she stands, she has to make sure she knows where they are. She has had about 6-7 falls since 05/23/22. She does not use an assistive device to help walk.   She endorses being off balance particularly when she closes her eyes or has her eyes covered (taking a shower or putting on a shirt).   She denies any symptoms prior to 05/23/22. Patient was in the hospital the day before. She had a DVT at that visit. She has been seen at the ED and been told she has neuropathy.   Patient is currently on Gabapentin  600 mg TID. She feels like taking the gabapentin  may intensify her symptoms, then her symptoms improve after 40 minutes. She briefly switched gabapentin  to Lyrica, but this made her feet swell, so she was put back on gabapentin . She feels like the numbness and tingling are about the same, but her walking has improved since 05/23/22. She went to physical therapy for the gait imbalance. She stopped about 1 month ago. She did feel like that helped.   Patient has seen rheumatology, Dr. Strazanac. Patient has a positive ANA, SSA, and SSB.   The patient has not had similar episodes of symptoms in the past. She has not had previous episodes of losing vision in one eye. She did have an episode of the left side of her body acutely becoming weak in 2017. It  took about 2 weeks for function to return. She has no residual deficits.   EtOH use: Currently drinks 1.5 bottles of wine  per day. 1-2 handles of liquor per day was prior to June Restrictive diet? No Family history of neuropathy/myopathy/NM disease? No   Of note, patient saw Dr. Onita at [facility] neurology in 2017 after her left sided weakness episode. Per patient, she had NCS at that time but was told that was normal (I do not see this in the chart). She was told her left sided symptoms were related to migraine per patient.   12/20/22: Records from rheumatology showed that patient had a positive ANA, SSA, and SSB abs. Symptoms were not clearly rheumatologic though per notes. MRI of brain and cervical spine were normal. Patient did not get labs after last visit.   Patient is walking better. She can stand, but can still get wobbly. She has had no falls. She completed physical therapy. She walks frequently at work.    Patient continues to take gabapentin . She only takes it when she has bad tingling/pain. She took it one time last week. She mentions gabapentin  was making her hair thin and fall out. She also uses lidocaine  cream.   She drinks 3-4 glasses of wine per day currently.   06/20/23: B1 was low (<6). I recommended supplementation with B1 100 mg daily. She is not taking this. Her IFE was indeterminate. Her A1c was 5.7.    Patient is having swelling of her left leg. The right  leg does not swell as much. She is wearing compression stockings. She also mentions that she feels her left toes are dragging. She thinks the numbness and tingling in arms and legs are about the same. She does think she is walking better.   She has also had bowel and bladder loss, about 5 times, but only at night. She will wake up and notice the accident in the bed. She has had some accidents during the day, but when she is trying to get to the bathroom, like she can't get there fast enough. She denies saddle anesthesia. She has only infrequent back pain.   Patient has had one fall since last visit. She got dizzy and fell.   Patient is taking  gabapentin  600 mg at bedtime. It makes her sleepy. She feels good in the morning, but has pain during the day. 600 mg makes her too sleepy to take during the day usually. It helps when she takes it.   Patient is drinking 1 bottle of wine per day. She feels like this helps her pain.   12/21/23: Labs were significant for low vit D. I recommended 50000 units once a week. She never took this because she was told it was not sent in. Her repeat IFE showed no M protein. She stopped taking B1, thinking she didn't have to keep taking it.   MRI lumbar spine showed no severe stenosis but arthritic/degenerative changes.    She has increased her EtOH use again. She has drank a 1/2 gallon mixed drink. She thinks the numbness and tingling is about the same or better. She still has imbalance. She may have fallen twice since last visit without injury.   She mentions occasional sore throat, but no other new issues.  Most recent Assessment and Plan (12/21/23): This is Sonya Hurst, a 43 y.o. 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 She had low B1 (12/2022) and vit D which is likely contributing. She also has positive SSA and SSB antibodies, so a Sjogren's sensory neuronopathy could also be contributing. She continues to drink heavy EtOH and has not been taking B1 or vit D supplementation. I have discussed EMG with patient, but she had a bad experience in the past and is not interested in repeating. She has good pain control currently with gabapentin .   Plan: -Lab work: B1, B12, CBC, CMP -Refilled B1 100 mg daily -Resent vit D 50000 international units daily -Discussed continuing to cut down on EtOH as no EtOH would improve nerve healing  -Continue Gabapentin  300 mg in afternoon and 600 mg at bedtime    Since their last visit: ***  Gabapentin  now 300/300/600  EtOH use?  B1 and Vit D?  ROS: Pertinent positive and negative  systems reviewed in HPI. ***   MEDICATIONS:  Outpatient Encounter Medications as of 06/19/2024  Medication Sig Note   cetirizine (ZYRTEC) 10 MG tablet Take 10 mg by mouth daily.    fluticasone  (FLONASE ) 50 MCG/ACT nasal spray Place 2 sprays into both nostrils daily.    furosemide  (LASIX ) 40 MG tablet Take 40 mg by mouth daily. (Patient not taking: Reported on 05/08/2024)    gabapentin  (NEURONTIN ) 300 MG capsule Take 1 capsule (300 mg) by mouth in the morning and take 2 capsules (600 mg) by mouth in the evening    lisinopril  (ZESTRIL ) 30 MG tablet Take 30 mg by mouth daily. 05/08/2024: Patient reports her lisinopril  dose is 30mg .  Most recent fill listed in dispense history is for 20mg .   methocarbamol  (ROBAXIN ) 750 MG tablet Take 1 tablet (750 mg total) by mouth every 8 (eight) hours as needed.    omeprazole (PRILOSEC) 40 MG capsule Take 40 mg by mouth daily.    ondansetron  (ZOFRAN ) 4 MG tablet Take 1 tablet (4 mg total) by mouth every 6 (six) hours. (Patient taking differently: Take 8 mg by mouth every 6 (six) hours as needed for vomiting or nausea.)    oxyCODONE -acetaminophen  (PERCOCET) 5-325 MG tablet Take 1 tablet by mouth every 6 (six) hours as needed for severe pain (pain score 7-10) or moderate pain (pain score 4-6). Can increase to 2 pills at a time as needed    Potassium Chloride  40 MEQ/15ML (20%) SOLN daily. (Patient not taking: Reported on 05/08/2024)    thiamine  (VITAMIN B1) 100 MG tablet Take 1 tablet (100 mg total) by mouth daily.    traZODone (DESYREL) 100 MG tablet Take 100 mg by mouth at bedtime as needed.    WEGOVY 0.25 MG/0.5ML SOAJ Inject 0.25 mg into the skin once a week. (Patient not taking: Reported on 05/08/2024)    No facility-administered encounter medications on file as of 06/19/2024.    PAST MEDICAL HISTORY: Past Medical History:  Diagnosis Date   Alcohol abuse    BMI 50.0-59.9, adult (HCC)    Cigarette nicotine dependence with other nicotine-induced disorder     Depression    Exertional dyspnea    Gait disorder    GERD (gastroesophageal reflux disease)    History of DVT (deep vein thrombosis)    Hypertension    Insomnia    Migraines    Morbid obesity (HCC)    Multiple falls    Neuropathy    Neuropathy    Obesity    Urinary incontinence     PAST SURGICAL HISTORY: Past Surgical History:  Procedure Laterality Date   CHOLECYSTECTOMY N/A 05/09/2024   Procedure: LAPAROSCOPIC CHOLECYSTECTOMY WITH INTRAOPERATIVE CHOLANGIOGRAM;  Surgeon: Sheldon Standing, MD;  Location: [facility];  Service: General;  Laterality: N/A;  Possible intraoperative cholangiogram   ESSURE TUBAL LIGATION  2008   FINGER FRACTURE SURGERY      ALLERGIES: Allergies  Allergen Reactions   Olopatadine Hcl Swelling    Ophthalmic swelling    FAMILY HISTORY: Family History  Problem Relation Age of Onset   Sleep apnea Father     SOCIAL HISTORY: Social History   Tobacco Use   Smoking status: Every Day    Current packs/day: 0.25    Average packs/day: 0.3 packs/day for 18.0 years (4.5 ttl pk-yrs)    Types: Cigarettes    Passive exposure: Never   Smokeless tobacco: Current   Tobacco comments:    Smoke about 5 per day.  Vaping Use   Vaping status: Never Used  Substance Use Topics   Alcohol use: Yes    Alcohol/week: 0.0 standard drinks of alcohol   Drug use: No   Social History   Social History Narrative   Denies caffeine use.    Objective:  Vital Signs:  There were no vitals taken for this visit.  General:*** General appearance: Awake and alert. No distress. Cooperative with exam.  Skin: No obvious rash or jaundice. HEENT: Atraumatic. Anicteric. Lungs: Non-labored breathing on room air  Heart: Regular Abdomen: Soft, non tender. Extremities: No edema. No obvious deformity.  Musculoskeletal: No obvious joint swelling.  Neurological: Mental Status: Alert. Speech fluent. No pseudobulbar affect Cranial Nerves: CNII: No RAPD. Visual fields  intact. CNIII,  IV, VI: PERRL. No nystagmus. EOMI. CN V: Facial sensation intact bilaterally to fine touch. [place] clench strong. Jaw jerk***. CN VII: Facial muscles symmetric and strong. No ptosis at rest or after sustained upgaze***. CN VIII: Hears finger rub well bilaterally. CN IX: No hypophonia. CN X: Palate elevates symmetrically. CN XI: Full strength shoulder shrug bilaterally. CN XII: Tongue protrusion full and midline. No atrophy or fasciculations. No significant dysarthria*** Motor: Tone is ***. *** fasciculations in *** extremities. *** atrophy. No grip or percussive myotonia.  Individual muscle group testing (MRC grade out of 5):  Movement     Neck flexion ***    Neck extension ***     Right Left   Shoulder abduction *** ***   Shoulder adduction *** ***   Shoulder ext rotation *** ***   Shoulder int rotation *** ***   Elbow flexion *** ***   Elbow extension *** ***   Wrist extension *** ***   Wrist flexion *** ***   Finger abduction - FDI *** ***   Finger abduction - ADM *** ***   Finger extension *** ***   Finger distal flexion - 2/3 *** ***   Finger distal flexion - 4/5 *** ***   Thumb flexion - FPL *** ***   Thumb abduction - APB *** ***    Hip flexion *** ***   Hip extension *** ***   Hip adduction *** ***   Hip abduction *** ***   Knee extension *** ***   Knee flexion *** ***   Dorsiflexion *** ***   Plantarflexion *** ***   Inversion *** ***   Eversion *** ***   Great toe extension *** ***   Great toe flexion *** ***     Reflexes:  Right Left  Bicep *** ***  Tricep *** ***  BrRad *** ***  Knee *** ***  Ankle *** ***   Pathological Reflexes: Babinski: *** response bilaterally*** Hoffman: *** Troemner: *** Pectoral: *** Palmomental: *** Facial: *** Midline tap: *** Sensation: Pinprick: *** Vibration: *** Temperature: *** Proprioception: *** Coordination: Intact finger-to- nose-finger and heel-to-shin bilaterally. Romberg negative.*** Gait:  Able to rise from chair with arms crossed unassisted. Normal, narrow-based gait. Able to tandem walk. Able to walk on toes and heels.***   Lab and Test Review: New results: 12/21/23: B1: <6 B12: 464 CBC significant for MCV 114.9 CMP significant for alk phos 173 and AST 55, normal ALT  CMP (05/10/24): albumin 2.4, alk phos 133, AST 184, ALT 55  RUQ US  (05/08/24): IMPRESSION: 1. Cholelithiasis without acute cholecystitis. 2. Increased hepatic echogenicity, a nonspecific finding that is most commonly seen on the basis of steatosis in the absence of known liver disease.  Previously reviewed results: 06/20/23: Vit D: 7.20 IFE: no M protein   12/20/22: B1 < 6 HbA1c: 5.7 IFE: A poorly-defined area of restricted protein mobility is detected  and is reactive with IgA and lambda antisera.    12/04/22: CBC significant for MCV of 109.2 (chronically elevated)   Normal or unremarkable: TSH BMP significant for mildly elevated glucose LFTs mildly elevated (AST > ALT) CBC significant for MCV of 111.3; platelets 141 B12 (05/18/22): 1464   MRI brain and cervical spine w/wo contrast (10/05/22): FINDINGS: MRI HEAD FINDINGS   Brain: No acute infarction, hemorrhage, hydrocephalus, extra-axial collection or mass lesion. No white matter disease. No pathologic enhancement.   Vascular: Major arterial voids are maintained at the skull base.   Skull and upper cervical spine: Normal  marrow signal.   Sinuses/Orbits: Minimal paranasal sinus mucosal thickening. No acute orbital findings.   Other: No mastoid effusions.   MRI CERVICAL SPINE FINDINGS   Alignment: Mild reversal the normal cervical lordosis. No substantial sagittal subluxation.   Vertebrae: Vertebral body heights are maintained. No marrow edema to suggest acute fracture or discitis/osteomyelitis. No suspicious bone lesions   Cord: Normal cord signal.  No abnormal cord enhancement.   Posterior Fossa, vertebral arteries, paraspinal  tissues: Visualized vertebral artery flow voids are maintained.   Disc levels: Motion limited without evidence of significant disc protrusion, foraminal stenosis, or canal stenosis.   IMPRESSION: 1. Normal brain MRI.  No evidence of acute intracranial abnormality. 2. Unremarkable MRI of the cervical spine. No significant stenosis and normal cord signal.   MRI lumbar spine wo contrast (08/20/22): FINDINGS: Segmentation: Standard. Lowest well-formed disc space labeled the L5-S1 level.   Alignment: Physiologic with preservation of the normal lumbar lordosis. No listhesis.   Vertebrae: Vertebral body height maintained without acute or chronic fracture. Bone marrow signal intensity diffusely decreased on T1 weighted sequence, nonspecific, but most commonly related to anemia, smoking or obesity. No discrete or worrisome osseous lesions. Mild reactive marrow edema present about the L4-5 facets due to facet arthritis. Mild reactive endplate change noted about the T11-12 interspace anteriorly.   Conus medullaris and cauda equina: Conus extends to the L1 level. Conus and cauda equina appear normal.   Paraspinal and other soft tissues: Unremarkable.   Disc levels:   L1-2:  Unremarkable.   L2-3:  Unremarkable.   L3-4: Normal interspace. Mild right greater than left facet hypertrophy with associated trace joint effusions. No spinal stenosis. Foramina remain patent.   L4-5: Normal interspace. Moderate bilateral facet arthrosis with associated mild reactive marrow edema and trace joint effusions. Mild epidural lipomatosis. No canal or foraminal stenosis.   L5-S1: Normal interspace. Moderate facet hypertrophy. Epidural lipomatosis. No canal or foraminal stenosis.   IMPRESSION: 1. Mild-to-moderate facet hypertrophy at L3-4 through L5-S1, most pronounced at L4-5 where there is associated mild reactive marrow edema. Finding could serve as a source for lower back pain. 2. Otherwise  unremarkable MRI of the lumbar spine. No significant disc pathology, stenosis, or evidence for neural impingement.   MRI brain wo contrast (12/30/2015): FINDINGS: Axial diffusion only was performed. When accounting for superficial areas of susceptibility artifact, more prominent that unusual in the setting of multi focal falx ossification, there is no evidence of acute infarct. No hydrocephalus or shift.   IMPRESSION: Diffusion only study that is negative for infarct or other acute finding.  MRI lumbar spine wo contrast (07/12/23): FINDINGS: Segmentation:  Standard.   Alignment:  Physiologic.   Vertebrae:  No fracture, evidence of discitis, or bone lesion.   Conus medullaris and cauda equina: Conus extends to the L1-L2 disc space level. Conus and cauda equina appear normal.   Paraspinal and other soft tissues: There is T2/stir hyperintense signal surrounding the bilateral facet joints at L4-L5 and L5-S1. These findings are most likely degenerative in nature.   Disc levels:   T12-L1: Mild bilateral facet degenerative change. No spinal canal or neural foraminal stenosis.   L1-L2: Mild bilateral facet degenerative change. No spinal canal or neural foraminal stenosis.   L2-L3: Mild bilateral facet degenerative change. No spinal canal or neural foraminal stenosis.   L3-L4: Mild bilateral facet degenerative change. No spinal canal or neural foraminal stenosis.   L4-L5: Severe bilateral facet degenerative change. Ligamentum flavum hypertrophy. No significant disc bulge. There  is mild bony spinal canal narrowing. There is epidural lipomatosis that results in moderate narrowing of the thecal sac. Mild right neural foraminal narrowing.   L5-S1: Severe bilateral facet degenerative change. No significant disc bulge. No bony spinal canal narrowing. No neural foraminal narrowing.   IMPRESSION: 1. No definite finding to explain bilateral upper and lower extremity numbness. Given  history of chronic alcohol use, if there is concern for subacute combined degeneration / B12 deficiency, further evaluation with a cervical and thoracic spine MRI could be considered. 2. No evidence of high-grade bony spinal canal narrowing. 3. Severe bilateral facet degenerative change at L4-L5 and L5-S1. 4. T2/STIR hyperintense signal surrounding the bilateral facet joints at L4-L5 and L5-S1, most likely degenerative in nature.  ASSESSMENT: This is Sonya Hurst, a 43 y.o. female with:  ***  Plan: ***  Return to clinic in ***  Total time spent reviewing records, interview, history/exam, documentation, and coordination of care on day of encounter:  *** min  Venetia Potters, MD

## 2024-06-19 ENCOUNTER — Ambulatory Visit: Payer: Medicaid Other | Admitting: Neurology

## 2024-06-29 ENCOUNTER — Other Ambulatory Visit: Payer: Self-pay | Admitting: Neurology

## 2024-06-29 DIAGNOSIS — E559 Vitamin D deficiency, unspecified: Secondary | ICD-10-CM

## 2024-07-21 ENCOUNTER — Encounter: Payer: Self-pay | Admitting: Pulmonary Disease

## 2024-07-21 ENCOUNTER — Ambulatory Visit (INDEPENDENT_AMBULATORY_CARE_PROVIDER_SITE_OTHER): Admitting: Pulmonary Disease

## 2024-07-21 VITALS — BP 110/68 | HR 116 | Temp 98.7°F | Ht 64.0 in | Wt 310.2 lb

## 2024-07-21 DIAGNOSIS — Z86718 Personal history of other venous thrombosis and embolism: Secondary | ICD-10-CM

## 2024-07-21 DIAGNOSIS — R0602 Shortness of breath: Secondary | ICD-10-CM

## 2024-07-21 DIAGNOSIS — F17210 Nicotine dependence, cigarettes, uncomplicated: Secondary | ICD-10-CM

## 2024-07-21 DIAGNOSIS — Z6841 Body Mass Index (BMI) 40.0 and over, adult: Secondary | ICD-10-CM

## 2024-07-21 DIAGNOSIS — R000 Tachycardia, unspecified: Secondary | ICD-10-CM | POA: Diagnosis not present

## 2024-07-21 LAB — NITRIC OXIDE: Nitric Oxide: <5

## 2024-07-21 NOTE — Patient Instructions (Signed)
 VISIT SUMMARY:  During your visit, we discussed your shortness of breath that started after your gallbladder surgery. We also talked about your fast heart rate and smoking habits.  YOUR PLAN:  -DYSPNEA ON EXERTION: Dyspnea on exertion means experiencing shortness of breath during activities like standing or walking. This may be due to a possible injury to your diaphragm from the surgery. We will order a chest x-ray to check for any injury and perform a walking test to monitor your oxygen levels.  -TACHYCARDIA: Tachycardia is a condition where your heart beats faster than normal. Although your heart scan was normal, the fast heart rate might be making you feel short of breath.  -NICOTINE DEPENDENCE WITH SMOKER'S COUGH: Nicotine dependence means you are addicted to smoking, which is causing a morning cough with clear mucus. Quitting smoking can help reduce these symptoms.  INSTRUCTIONS:  We have placed an order for the chest x-ray to be done at [facility].  Just go to admissions at [facility] and let them know that you have a chest x-ray ordered. Additionally, consider taking steps to quit smoking to improve your overall health.  We will see you in follow-up in 3 months time call sooner should any new problems arise.

## 2024-07-21 NOTE — Progress Notes (Unsigned)
 Subjective:    Patient ID: Sonya Hurst, female    DOB: [birth year], 43 y.o.   MRN: [id number]  Patient Care Team: Cleotilde, Virginia  E, PA as PCP - General (Internal Medicine) Leigh Venetia CROME, MD as Consulting Physician (Neurology) Sheldon Standing, MD as Consulting Physician (General Surgery) [place], Peter M, MD as Consulting Physician (Cardiology) Elicia Claw, MD as Consulting Physician (Gastroenterology)  Chief Complaint  Patient presents with   Consult    Shortness of breath on exertion and at rest. Wheezing at night. No cough.     BACKGROUND:   HPI    Review of Systems A 10 point review of systems was performed and it is as noted above otherwise negative.   Past Medical History:  Diagnosis Date   Alcohol abuse    BMI 50.0-59.9, adult (HCC)    Cigarette nicotine dependence with other nicotine-induced disorder    Depression    Exertional dyspnea    Gait disorder    GERD (gastroesophageal reflux disease)    History of DVT (deep vein thrombosis)    Hypertension    Insomnia    Migraines    Morbid obesity (HCC)    Multiple falls    Neuropathy    Neuropathy    Obesity    Urinary incontinence     Past Surgical History:  Procedure Laterality Date   CHOLECYSTECTOMY N/A 05/09/2024   Procedure: LAPAROSCOPIC CHOLECYSTECTOMY WITH INTRAOPERATIVE CHOLANGIOGRAM;  Surgeon: Sheldon Standing, MD;  Location: [facility];  Service: General;  Laterality: N/A;  Possible intraoperative cholangiogram   ESSURE TUBAL LIGATION  2008   FINGER FRACTURE SURGERY      Patient Active Problem List   Diagnosis Date Noted   Intractable vomiting 05/08/2024   Right sided weakness 01/03/2016   Dysarthria 01/03/2016    Family History  Problem Relation Age of Onset   Sleep apnea Father     Social History   Tobacco Use   Smoking status: Every Day    Current packs/day: 0.25    Average packs/day: 0.3 packs/day for 26.6 years (6.6 ttl pk-yrs)    Types: Cigarettes    Start date: 1999     Passive exposure: Never   Smokeless tobacco: Never   Tobacco comments:    Smoke about 5 per day.  Substance Use Topics   Alcohol use: Yes    Alcohol/week: 0.0 standard drinks of alcohol    Allergies  Allergen Reactions   Olopatadine Hcl Swelling    Ophthalmic swelling  Other Reaction(s): eyes swell    Current Meds  Medication Sig   cetirizine (ZYRTEC) 10 MG tablet Take 10 mg by mouth daily.   fluticasone  (FLONASE ) 50 MCG/ACT nasal spray Place 2 sprays into both nostrils daily.   furosemide  (LASIX ) 40 MG tablet Take 40 mg by mouth daily.   gabapentin  (NEURONTIN ) 300 MG capsule Take 1 capsule (300 mg) by mouth in the morning and take 2 capsules (600 mg) by mouth in the evening   lisinopril  (ZESTRIL ) 30 MG tablet Take 30 mg by mouth daily.   methocarbamol  (ROBAXIN ) 750 MG tablet Take 1 tablet (750 mg total) by mouth every 8 (eight) hours as needed.   omeprazole (PRILOSEC) 40 MG capsule Take 40 mg by mouth daily.   ondansetron  (ZOFRAN ) 4 MG tablet Take 1 tablet (4 mg total) by mouth every 6 (six) hours. (Patient taking differently: Take 8 mg by mouth every 6 (six) hours as needed for vomiting or nausea.)   oxyCODONE -acetaminophen  (PERCOCET) 5-325 MG tablet  Take 1 tablet by mouth every 6 (six) hours as needed for severe pain (pain score 7-10) or moderate pain (pain score 4-6). Can increase to 2 pills at a time as needed   Potassium Chloride  40 MEQ/15ML (20%) SOLN daily.   thiamine  (VITAMIN B1) 100 MG tablet Take 1 tablet (100 mg total) by mouth daily.   traZODone (DESYREL) 100 MG tablet Take 100 mg by mouth at bedtime as needed.   WEGOVY 0.25 MG/0.5ML SOAJ Inject 0.25 mg into the skin once a week.    Immunization History  Administered Date(s) Administered   Influenza-Unspecified 12/16/2018        Objective:     BP 110/68 (BP Location: Left Arm, Patient Position: Sitting, Cuff Size: Large)   Pulse (!) 116   Temp 98.7 F (37.1 C) (Oral)   Ht 5' 4 (1.626 m)   Wt (!) 310 lb  3.2 oz (140.7 kg)   SpO2 100%   BMI 53.25 kg/m   SpO2: 100 %  GENERAL: HEAD: Normocephalic, atraumatic.  EYES: Pupils equal, round, reactive to light.  No scleral icterus.  MOUTH:  NECK: Supple. No thyromegaly. Trachea midline. No JVD.  No adenopathy. PULMONARY: Good air entry bilaterally.  No adventitious sounds. CARDIOVASCULAR: S1 and S2. Regular rate and rhythm.  ABDOMEN: MUSCULOSKELETAL: No joint deformity, no clubbing, no edema.  NEUROLOGIC:  SKIN: Intact,warm,dry. PSYCH:  Lab Results  Component Value Date   NITRICOXIDE <5 07/21/2024         Assessment & Plan:   No diagnosis found.  No orders of the defined types were placed in this encounter.   No orders of the defined types were placed in this encounter.    Advised if symptoms do not improve or worsen, to please contact office for sooner follow up or seek emergency care.    I spent xxx minutes of dedicated to the care of this patient on the date of this encounter to include pre-visit review of records, face-to-face time with the patient discussing conditions above, post visit ordering of testing, clinical documentation with the electronic health record, making appropriate referrals as documented, and communicating necessary findings to members of the patients care team.   C. Leita Sanders, MD Advanced Bronchoscopy PCCM [facility]    *This note was dictated using voice recognition software/Dragon.  Despite best efforts to proofread, errors can occur which can change the meaning. Any transcriptional errors that result from this process are unintentional and may not be fully corrected at the time of dictation.

## 2024-07-23 ENCOUNTER — Encounter: Payer: Self-pay | Admitting: Pulmonary Disease

## 2024-07-26 ENCOUNTER — Encounter: Payer: Self-pay | Admitting: Pulmonary Disease

## 2024-07-28 NOTE — Telephone Encounter (Signed)
 Advised that patient can go to outpatient imaging on [place]. NFN.

## 2024-08-04 ENCOUNTER — Ambulatory Visit (HOSPITAL_BASED_OUTPATIENT_CLINIC_OR_DEPARTMENT_OTHER)
Admission: RE | Admit: 2024-08-04 | Discharge: 2024-08-04 | Disposition: A | Discharge status: Home / Self Care | Source: Ambulatory Visit | Attending: Pulmonary Disease | Admitting: Pulmonary Disease

## 2024-08-04 DIAGNOSIS — R0602 Shortness of breath: Secondary | ICD-10-CM | POA: Insufficient documentation

## 2024-08-06 ENCOUNTER — Ambulatory Visit: Payer: Self-pay | Admitting: Pulmonary Disease

## 2024-08-11 ENCOUNTER — Other Ambulatory Visit (HOSPITAL_COMMUNITY): Payer: Self-pay | Admitting: Internal Medicine

## 2024-08-11 ENCOUNTER — Ambulatory Visit (HOSPITAL_COMMUNITY)
Admission: RE | Admit: 2024-08-11 | Discharge: 2024-08-11 | Disposition: A | Discharge status: Home / Self Care | Source: Ambulatory Visit | Attending: Surgery | Admitting: Surgery

## 2024-08-11 DIAGNOSIS — M79605 Pain in left leg: Secondary | ICD-10-CM | POA: Insufficient documentation

## 2024-08-11 DIAGNOSIS — M79604 Pain in right leg: Secondary | ICD-10-CM

## 2024-08-27 ENCOUNTER — Ambulatory Visit (INDEPENDENT_AMBULATORY_CARE_PROVIDER_SITE_OTHER): Admitting: Pulmonary Disease

## 2024-08-27 ENCOUNTER — Encounter: Payer: Self-pay | Admitting: Pulmonary Disease

## 2024-08-27 ENCOUNTER — Ambulatory Visit: Admitting: Pulmonary Disease

## 2024-08-27 VITALS — BP 110/76 | HR 96 | Temp 97.9°F | Ht 64.0 in | Wt 314.0 lb

## 2024-08-27 DIAGNOSIS — J4530 Mild persistent asthma, uncomplicated: Secondary | ICD-10-CM

## 2024-08-27 DIAGNOSIS — R0602 Shortness of breath: Secondary | ICD-10-CM

## 2024-08-27 DIAGNOSIS — R0683 Snoring: Secondary | ICD-10-CM

## 2024-08-27 DIAGNOSIS — R062 Wheezing: Secondary | ICD-10-CM | POA: Diagnosis not present

## 2024-08-27 DIAGNOSIS — F17210 Nicotine dependence, cigarettes, uncomplicated: Secondary | ICD-10-CM | POA: Diagnosis not present

## 2024-08-27 DIAGNOSIS — Z9189 Other specified personal risk factors, not elsewhere classified: Secondary | ICD-10-CM

## 2024-08-27 LAB — PULMONARY FUNCTION TEST
DL/VA % pred: 102 %
DL/VA: 4.46 ml/min/mmHg/L
DLCO unc % pred: 73 %
DLCO unc: 15.73 ml/min/mmHg
FEF 25-75 Post: 3.02 L/s
FEF 25-75 Pre: 2.3 L/s
FEF2575-%Change-Post: 31 %
FEF2575-%Pred-Post: 102 %
FEF2575-%Pred-Pre: 77 %
FEV1-%Change-Post: 5 %
FEV1-%Pred-Post: 72 %
FEV1-%Pred-Pre: 68 %
FEV1-Post: 2.12 L
FEV1-Pre: 2.01 L
FEV1FVC-%Change-Post: 3 %
FEV1FVC-%Pred-Pre: 104 %
FEV6-%Change-Post: 1 %
FEV6-%Pred-Post: 67 %
FEV6-%Pred-Pre: 66 %
FEV6-Post: 2.41 L
FEV6-Pre: 2.37 L
FEV6FVC-%Pred-Post: 102 %
FEV6FVC-%Pred-Pre: 102 %
FVC-%Change-Post: 1 %
FVC-%Pred-Post: 66 %
FVC-%Pred-Pre: 65 %
FVC-Post: 2.41 L
FVC-Pre: 2.37 L
Post FEV1/FVC ratio: 88 %
Post FEV6/FVC ratio: 100 %
Pre FEV1/FVC ratio: 85 %
Pre FEV6/FVC Ratio: 100 %
RV % pred: 82 %
RV: 1.4 L
TLC % pred: 76 %
TLC: 3.86 L

## 2024-08-27 LAB — NITRIC OXIDE: Nitric Oxide: 7

## 2024-08-27 MED ORDER — VORTEX VALVED HOLDING CHAMBER DEVI: 0 refills | Status: AC

## 2024-08-27 MED ORDER — ALBUTEROL SULFATE HFA 108 (90 BASE) MCG/ACT IN AERS: 2.0000 | INHALATION_SPRAY | Freq: Four times a day (QID) | RESPIRATORY_TRACT | 2 refills | Status: AC | PRN

## 2024-08-27 MED ORDER — BUDESONIDE-FORMOTEROL FUMARATE 160-4.5 MCG/ACT IN AERO: 2.0000 | INHALATION_SPRAY | Freq: Two times a day (BID) | RESPIRATORY_TRACT | 11 refills | Status: AC

## 2024-08-27 NOTE — Progress Notes (Signed)
 Subjective:    Patient ID: Sonya Hurst, female    DOB: May 26, 1981, 43 y.o.   MRN: [id number]  Patient Care Team: Cleotilde Hartford BRAVO, PA as PCP - General (Internal Medicine) Leigh Venetia CROME, MD as Consulting Physician (Neurology) Sheldon Standing, MD as Consulting Physician (General Surgery) [place], Peter M, MD as Consulting Physician (Cardiology) Elicia Claw, MD as Consulting Physician (Gastroenterology)  Chief Complaint  Patient presents with   Shortness of Breath    Cough and wheezing. Shortness of breath on exertion.     BACKGROUND/INTERVAL: Patient is a 43 year old current smoker who presents for follow-up of shortness of breath on exertion and wheezing mostly noted at nighttime.  She was initially seen here on 21 July 2024.  HPI History of Present Illness Sonya Hurst is a 43 year old female who presents with shortness of breath.  She experiences persistent shortness of breath, which has slightly improved since her recent surgery. Her pulmonary function tests were performed today and are consistent with minimal obstructive airways disease and mild restriction likely on the basis of obesity with minimal diffusion defect which corrects to normal by alveolar volume also consistent with obesity.  She has sleep disturbances, waking up in the middle of the night to cough up phlegm and use the bathroom. Her son and boyfriend have noted that she snores. She feels unrested in the morning and has not undergone a sleep apnea test before.  She works at [employer] and frequently catches colds from her boss, which she believes contributes to her respiratory symptoms.  She was previously given Anoro during a COVID-19 infection but reports it had a powdery texture she did not prefer. No smoking.  Review of Systems A 10 point review of systems was performed and it is as noted above otherwise negative.   Patient Active Problem List   Diagnosis Date Noted    Intractable vomiting 05/08/2024   Right sided weakness 01/03/2016   Dysarthria 01/03/2016    Social History   Tobacco Use   Smoking status: Every Day    Current packs/day: 0.25    Average packs/day: 0.3 packs/day for 26.7 years (6.7 ttl pk-yrs)    Types: Cigarettes    Start date: 1999    Passive exposure: Never   Smokeless tobacco: Never   Tobacco comments:    Smoke about 5 per day.  Substance Use Topics   Alcohol use: Yes    Alcohol/week: 0.0 standard drinks of alcohol    Allergies  Allergen Reactions   Olopatadine Hcl Swelling    Ophthalmic swelling  Other Reaction(s): eyes swell    Current Meds  Medication Sig   albuterol  (VENTOLIN  HFA) 108 (90 Base) MCG/ACT inhaler Inhale 2 puffs into the lungs every 6 (six) hours as needed.   budesonide -formoterol  (SYMBICORT ) 160-4.5 MCG/ACT inhaler Inhale 2 puffs into the lungs 2 (two) times daily.   cetirizine (ZYRTEC) 10 MG tablet Take 10 mg by mouth daily.   fluticasone  (FLONASE ) 50 MCG/ACT nasal spray Place 2 sprays into both nostrils daily.   furosemide  (LASIX ) 40 MG tablet Take 40 mg by mouth daily. (Patient taking differently: Take 40 mg by mouth daily. PRN)   gabapentin  (NEURONTIN ) 300 MG capsule Take 1 capsule (300 mg) by mouth in the morning and take 2 capsules (600 mg) by mouth in the evening   lisinopril  (ZESTRIL ) 30 MG tablet Take 30 mg by mouth daily.   methocarbamol  (ROBAXIN ) 750 MG tablet Take 1 tablet (750 mg total)  by mouth every 8 (eight) hours as needed.   omeprazole (PRILOSEC) 40 MG capsule Take 40 mg by mouth daily.   ondansetron  (ZOFRAN ) 4 MG tablet Take 1 tablet (4 mg total) by mouth every 6 (six) hours.   oxyCODONE -acetaminophen  (PERCOCET) 5-325 MG tablet Take 1 tablet by mouth every 6 (six) hours as needed for severe pain (pain score 7-10) or moderate pain (pain score 4-6). Can increase to 2 pills at a time as needed   Potassium Chloride  40 MEQ/15ML (20%) SOLN daily.   Spacer/Aero-Holding Chambers (VORTEX  VALVED HOLDING CHAMBER) DEVI Use as directed with inhalers.   thiamine  (VITAMIN B1) 100 MG tablet Take 1 tablet (100 mg total) by mouth daily.   traZODone (DESYREL) 100 MG tablet Take 100 mg by mouth at bedtime as needed.   WEGOVY 0.25 MG/0.5ML SOAJ Inject 0.25 mg into the skin once a week.    Immunization History  Administered Date(s) Administered   Influenza-Unspecified 12/16/2018        Objective:     BP 110/76   Pulse 96   Temp 97.9 F (36.6 C) (Temporal)   Ht 5' 4 (1.626 m)   Wt (!) 314 lb (142.4 kg)   SpO2 99%   BMI 53.90 kg/m   SpO2: 99 %  GENERAL: Obese woman, no acute distress, fully ambulatory, no conversational dyspnea. HEAD: Normocephalic, atraumatic.  EYES: Pupils equal, round, reactive to light.  No scleral icterus.  MOUTH: Dentition intact, oral mucosa moist.  No thrush. NECK: Supple. No thyromegaly. Trachea midline. No JVD.  No adenopathy. PULMONARY: Good air entry bilaterally.  No adventitious sounds. CARDIOVASCULAR: S1 and S2.  Regular rate and rhythm.  No rubs, murmurs or gallops heard. ABDOMEN: Obese, otherwise benign. MUSCULOSKELETAL: No joint deformity, no clubbing, no edema.  NEUROLOGIC: No overt focal deficit, no gait disturbance, speech is fluent. SKIN: Intact,warm,dry. PSYCH: Mood and behavior normal.  Lab Results  Component Value Date   NITRICOXIDE 7 08/27/2024       08/27/2024   10:53 AM  Results of the Epworth flowsheet  Sitting and reading 3  Watching TV 1  Sitting, inactive in a public place (e.g. a theatre or a meeting) 1  As a passenger in a car for an hour without a break 2  Lying down to rest in the afternoon when circumstances permit 3  Sitting and talking to someone 1  Sitting quietly after a lunch without alcohol 2  In a car, while stopped for a few minutes in traffic 0  Total score 13    Assessment & Plan:     ICD-10-CM   1. Shortness of breath  R06.02 Nitric oxide     2. Mild persistent asthma without  complication  J45.30     3. At risk for sleep apnea  Z91.89 Split night study    4. Morbid obesity (HCC)  E66.01       Orders Placed This Encounter  Procedures   Nitric oxide    Split night study    Standing Status:   Future    Expected Date:   09/10/2024    Expiration Date:   08/27/2025    Where should this test be performed::   [facility]    Meds ordered this encounter  Medications   budesonide -formoterol  (SYMBICORT ) 160-4.5 MCG/ACT inhaler    Sig: Inhale 2 puffs into the lungs 2 (two) times daily.    Dispense:  10.2 g    Refill:  11   albuterol  (VENTOLIN  HFA)  108 (90 Base) MCG/ACT inhaler    Sig: Inhale 2 puffs into the lungs every 6 (six) hours as needed.    Dispense:  8 g    Refill:  2   Spacer/Aero-Holding Chambers (VORTEX VALVED HOLDING CHAMBER) DEVI    Sig: Use as directed with inhalers.    Dispense:  1 each    Refill:  0   Assessment and Plan Shortness of breath Persistent shortness of breath, slightly improved post-surgery. Possible contributing factors include weight and potential sleep apnea. Differential includes asthma, given the low level of airway inflammation but potential for non-measurable inflammation.  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 Trial of inhaler therapy to assess response. - Trial of Symbicort  160/4.5, two inhalations twice a day - Provide spacer for inhaler - Prescribe albuterol  as needed  Obstructive sleep apnea risk High risk for obstructive sleep apnea based on high Epworth scale and significant symptoms such as snoring. No prior sleep apnea test conducted. - Order in-lab sleep study at [facility] due to proximity to her  residence   Advised if symptoms do not improve or worsen, to please contact office for sooner follow up or seek emergency care.    I spent 40 minutes of dedicated to the care of this patient on the date of this encounter to include pre-visit review of records, face-to-face time with the patient discussing conditions above, post visit ordering of testing, clinical documentation with the electronic health record, making appropriate referrals as documented, and communicating necessary findings to members of the patients care team.     C. Leita Sanders, MD Advanced Bronchoscopy PCCM [facility]    *This note was generated using voice recognition software/Dragon and/or AI transcription program.  Despite best efforts to proofread, errors can occur which can change the meaning. Any transcriptional errors that result from this process are unintentional and may not be fully corrected at the time of dictation.

## 2024-08-27 NOTE — Progress Notes (Signed)
 Full PFT completed today ? ?

## 2024-08-27 NOTE — Patient Instructions (Signed)
 VISIT SUMMARY:  During your visit, we discussed your persistent shortness of breath, potential asthma, and risk for obstructive sleep apnea. We reviewed your symptoms, including sleep disturbances and snoring, and considered your work environment's impact on your respiratory health. We also discussed your previous experience with inhalers and planned a new treatment approach.  YOUR PLAN:  -SHORTNESS OF BREATH: Your shortness of breath has slightly improved since your recent surgery. We suspect that your weight and potential sleep apnea might be contributing factors. We will check your airway inflammation level and start you on Symbicort  160/4.5, two inhalations twice a day, along with a spacer for the inhaler. Additionally, you will have albuterol  prescribed for use as needed.  -SUSPECTED LATE ONSET NONALLERGIC ASTHMA: We suspect you might have late onset nonallergic asthma, which is asthma that develops later in life and is not triggered by allergies. This is due to your persistent symptoms and low airway inflammation. We will trial the Symbicort  160/4.5 inhaler, two inhalations twice a day, and provide a spacer for the inhaler. Albuterol  will also be prescribed for use as needed.  -OBSTRUCTIVE SLEEP APNEA RISK: You are at high risk for obstructive sleep apnea, a condition where your breathing repeatedly stops and starts during sleep, based on your symptoms and high Epworth scale score. We will order an in-lab sleep study at [facility], which is close to your home, to confirm this diagnosis.  INSTRUCTIONS:  Please follow up with the in-lab sleep study at [place] as soon as possible. Continue using the Symbicort  inhaler as prescribed and use albuterol  as needed. We will review your airway inflammation levels and adjust your treatment plan accordingly.

## 2024-08-27 NOTE — Patient Instructions (Signed)
 Full PFT completed today ? ?

## 2024-09-17 ENCOUNTER — Encounter: Payer: Self-pay | Admitting: Pulmonary Disease

## 2024-09-17 DIAGNOSIS — Z9189 Other specified personal risk factors, not elsewhere classified: Secondary | ICD-10-CM

## 2024-09-18 NOTE — Telephone Encounter (Signed)
 First rule of sleep hygiene is not the watch TV before going to sleep.  We can certainly switch her to a home sleep study.

## 2024-10-09 ENCOUNTER — Encounter

## 2024-10-09 DIAGNOSIS — Z9189 Other specified personal risk factors, not elsewhere classified: Secondary | ICD-10-CM

## 2024-10-14 NOTE — Progress Notes (Signed)
 I saw Sonya Hurst in neurology clinic on 10/23/24 in follow up for neuropathy.  HPI: Sonya Hurst is a 43 y.o. year old female with a history of Sjogren's syndrome, HTN, current smoker, obesity, DVT (on eliquis ), and EtOH abuse who we last saw on 12/21/23.  To briefly review: Initial consult (09/14/22): Symptoms have been present since 05/23/22. She woke up and her legs and hands felt tight with sharp pains in her feet. She numbness was in her legs below the knee and in her hands. She was having trouble walking and was falling. When she stands, she has to make sure she knows where they are. She has had about 6-7 falls since 05/23/22. She does not use an assistive device to help walk.   She endorses being off balance particularly when she closes her eyes or has her eyes covered (taking a shower or putting on a shirt).   She denies any symptoms prior to 05/23/22. Patient was in the hospital the day before. She had a DVT at that visit. She has been seen at the ED and been told she has neuropathy.   Patient is currently on Gabapentin  600 mg TID. She feels like taking the gabapentin  may intensify her symptoms, then her symptoms improve after 40 minutes. She briefly switched gabapentin  to Lyrica, but this made her feet swell, so she was put back on gabapentin . She feels like the numbness and tingling are about the same, but her walking has improved since 05/23/22. She went to physical therapy for the gait imbalance. She stopped about 1 month ago. She did feel like that helped.   Patient has seen rheumatology, Dr. Strazanac. Patient has a positive ANA, SSA, and SSB.   The patient has not had similar episodes of symptoms in the past. She has not had previous episodes of losing vision in one eye. She did have an episode of the left side of her body acutely becoming weak in 2017. It  took about 2 weeks for function to return. She has no residual deficits.   EtOH use: Currently drinks 1.5 bottles of wine  per day. 1-2 handles of liquor per day was prior to June Restrictive diet? No Family history of neuropathy/myopathy/NM disease? No   Of note, patient saw Dr. Onita at [facility] neurology in 2017 after her left sided weakness episode. Per patient, she had NCS at that time but was told that was normal (I do not see this in the chart). She was told her left sided symptoms were related to migraine per patient.   12/20/22: Records from rheumatology showed that patient had a positive ANA, SSA, and SSB abs. Symptoms were not clearly rheumatologic though per notes. MRI of brain and cervical spine were normal. Patient did not get labs after last visit.   Patient is walking better. She can stand, but can still get wobbly. She has had no falls. She completed physical therapy. She walks frequently at work.    Patient continues to take gabapentin . She only takes it when she has bad tingling/pain. She took it one time last week. She mentions gabapentin  was making her hair thin and fall out. She also uses lidocaine  cream.   She drinks 3-4 glasses of wine per day currently.   06/20/23: B1 was low (<6). I recommended supplementation with B1 100 mg daily. She is not taking this. Her IFE was indeterminate. Her A1c was 5.7.    Patient is having swelling of her left leg. The right  leg does not swell as much. She is wearing compression stockings. She also mentions that she feels her left toes are dragging. She thinks the numbness and tingling in arms and legs are about the same. She does think she is walking better.   She has also had bowel and bladder loss, about 5 times, but only at night. She will wake up and notice the accident in the bed. She has had some accidents during the day, but when she is trying to get to the bathroom, like she can't get there fast enough. She denies saddle anesthesia. She has only infrequent back pain.   Patient has had one fall since last visit. She got dizzy and fell.   Patient is taking  gabapentin  600 mg at bedtime. It makes her sleepy. She feels good in the morning, but has pain during the day. 600 mg makes her too sleepy to take during the day usually. It helps when she takes it.   Patient is drinking 1 bottle of wine per day. She feels like this helps her pain.    12/21/23: Labs were significant for low vit D. I recommended 50000 units once a week. She never took this because she was told it was not sent in. Her repeat IFE showed no M protein. She stopped taking B1, thinking she didn't have to keep taking it.   MRI lumbar spine showed no severe stenosis but arthritic/degenerative changes.    She has increased her EtOH use again. She has drank a 1/2 gallon mixed drink. She thinks the numbness and tingling is about the same or better. She still has imbalance. She may have fallen twice since last visit without injury.   She mentions occasional sore throat, but no other new issues.  Most recent Assessment and Plan (12/21/23): This is Sonya Hurst, a 43 y.o. 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 She had low B1 (12/2022) and vit D which is likely contributing. She also has positive SSA and SSB antibodies, so a Sjogren's sensory neuronopathy could also be contributing. She continues to drink heavy EtOH and has not been taking B1 or vit D supplementation. I have discussed EMG with patient, but she had a bad experience in the past and is not interested in repeating. She has good pain control currently with gabapentin .   Plan: -Lab work: B1, B12, CBC, CMP -Refilled B1 100 mg daily -Resent vit D 50000 international units daily -Discussed continuing to cut down on EtOH as no EtOH would improve nerve healing  -Continue Gabapentin  300 mg in afternoon and 600 mg at bedtime   Since their last visit: B1 was low. I recommended B1 100 mg daily. She takes this sometimes. She is still taking vit D once a  week.  Patient's symptoms are about the same as prior. She is now taking gabapentin  300 mg in the morning, 300 mg midday, 300 mg after work, and 600 mg at night. She is happy this this currently.   She denies falls.  She is not drinking EtOH per report.   MEDICATIONS:  Outpatient Encounter Medications as of 10/23/2024  Medication Sig Note   albuterol  (VENTOLIN  HFA) 108 (90 Base) MCG/ACT inhaler Inhale 2 puffs into the lungs every 6 (six) hours as needed.    budesonide -formoterol  (SYMBICORT ) 160-4.5 MCG/ACT inhaler Inhale 2 puffs into the lungs 2 (two) times daily.    cetirizine (ZYRTEC) 10 MG tablet Take 10 mg by  mouth daily.    fluticasone  (FLONASE ) 50 MCG/ACT nasal spray Place 2 sprays into both nostrils daily.    furosemide  (LASIX ) 40 MG tablet Take 40 mg by mouth daily.    gabapentin  (NEURONTIN ) 300 MG capsule Take 1 capsule (300 mg) by mouth in the morning, 1 capsule (300 mg) midday, 1 capsule (300 mg) in the afternoon, and take 2 capsules (600 mg) by mouth at bedtime.    lisinopril  (ZESTRIL ) 30 MG tablet Take 30 mg by mouth daily. 05/08/2024: Patient reports her lisinopril  dose is 30mg . Most recent fill listed in dispense history is for 20mg .   methocarbamol  (ROBAXIN ) 750 MG tablet Take 1 tablet (750 mg total) by mouth every 8 (eight) hours as needed.    omeprazole (PRILOSEC) 40 MG capsule Take 40 mg by mouth daily.    ondansetron  (ZOFRAN ) 4 MG tablet Take 1 tablet (4 mg total) by mouth every 6 (six) hours. (Patient taking differently: Take 4 mg by mouth every 8 (eight) hours as needed.)    Potassium Chloride  40 MEQ/15ML (20%) SOLN daily.    Spacer/Aero-Holding Chambers (VORTEX VALVED HOLDING CHAMBER) DEVI Use as directed with inhalers.    thiamine  (VITAMIN B1) 100 MG tablet Take 1 tablet (100 mg total) by mouth daily.    traZODone (DESYREL) 100 MG tablet Take 100 mg by mouth at bedtime as needed.    WEGOVY 0.25 MG/0.5ML SOAJ Inject 0.25 mg into the skin once a week.    [DISCONTINUED]  gabapentin  (NEURONTIN ) 300 MG capsule Take 1 capsule (300 mg) by mouth in the morning and take 2 capsules (600 mg) by mouth in the evening    oxyCODONE -acetaminophen  (PERCOCET) 5-325 MG tablet Take 1 tablet by mouth every 6 (six) hours as needed for severe pain (pain score 7-10) or moderate pain (pain score 4-6). Can increase to 2 pills at a time as needed (Patient not taking: Reported on 10/23/2024)    No facility-administered encounter medications on file as of 10/23/2024.    PAST MEDICAL HISTORY: Past Medical History:  Diagnosis Date   Alcohol abuse    BMI 50.0-59.9, adult (HCC)    Cigarette nicotine dependence with other nicotine-induced disorder    Depression    Exertional dyspnea    Gait disorder    GERD (gastroesophageal reflux disease)    History of DVT (deep vein thrombosis)    Hypertension    Insomnia    Migraines    Morbid obesity (HCC)    Multiple falls    Neuropathy    Neuropathy    Obesity    Urinary incontinence     PAST SURGICAL HISTORY: Past Surgical History:  Procedure Laterality Date   CHOLECYSTECTOMY N/A 05/09/2024   Procedure: LAPAROSCOPIC CHOLECYSTECTOMY WITH INTRAOPERATIVE CHOLANGIOGRAM;  Surgeon: Sheldon Standing, MD;  Location: [facility];  Service: General;  Laterality: N/A;  Possible intraoperative cholangiogram   ESSURE TUBAL LIGATION  2008   FINGER FRACTURE SURGERY      ALLERGIES: Allergies  Allergen Reactions   Olopatadine Hcl Swelling    Ophthalmic swelling  Other Reaction(s): eyes swell    FAMILY HISTORY: Family History  Problem Relation Age of Onset   Sleep apnea Father     SOCIAL HISTORY: Social History   Tobacco Use   Smoking status: Every Day    Current packs/day: 0.25    Average packs/day: 0.3 packs/day for 26.8 years (6.7 ttl pk-yrs)    Types: Cigarettes    Start date: 1999    Passive exposure: Never   Smokeless  tobacco: Never   Tobacco comments:    Smoke about 5 per day.  Vaping Use   Vaping status: Never Used  Substance  Use Topics   Alcohol use: Yes    Alcohol/week: 0.0 standard drinks of alcohol   Drug use: No   Social History   Social History Narrative   Denies caffeine use.   Are you right handed or left handed? Right   Are you currently employed ?    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   Who lives with you? family   What type of home do you live in: 1 story or 2 story?     No caffeine    Objective:  Vital Signs:  BP 129/86   Pulse 75   Ht 5' 4 (1.626 m)   Wt 297 lb (134.7 kg)   SpO2 100%   BMI 50.98 kg/m   General: General appearance: Awake and alert. No distress. Cooperative with exam.  Skin: No obvious rash or jaundice. HEENT: Atraumatic. Anicteric. Lungs: Non-labored breathing on room air  Heart: Regular  Neurological: Mental Status: Alert. Speech fluent. No pseudobulbar affect Cranial Nerves: CNII: No RAPD. Visual fields intact. CNIII, IV, VI: PERRL. No nystagmus. EOMI. CN V: Facial sensation intact bilaterally to fine touch. CN VII: Facial muscles symmetric and strong. No ptosis at rest. CN VIII: Hears finger rub well bilaterally. CN IX: No hypophonia. CN X: Palate elevates symmetrically. CN XI: Full strength shoulder shrug bilaterally. CN XII: Tongue protrusion full and midline. No atrophy or fasciculations. No significant dysarthria Motor: Tone is normal. Strength is 5/5 in bilateral upper and lower extremities Reflexes:  Right Left  Bicep 2+ 2+  Tricep 2+ 2+  BrRad 2+ 2+  Knee 0 0  Ankle 0 0   Sensation: Pinprick: Diminished in lower extremities in length dependent fashion  Vibration: Absent in bilateral great toes, diminished in bilateral ankles, otherwise intact Coordination: Intact finger-to- nose-finger bilaterally.  Gait: Wide based gait.    Lab and Test Review: New results: 12/21/23: B1: <6 B12: 464 CBC significant for MCV 114.9 CMP significant for alk phos 173 and AST 55, normal ALT   CMP  (05/10/24): albumin 2.4, alk phos 133, AST 184, ALT 55   RUQ US  (05/08/24): IMPRESSION: 1. Cholelithiasis without acute cholecystitis. 2. Increased hepatic echogenicity, a nonspecific finding that is most commonly seen on the basis of steatosis in the absence of known liver disease.   Previously reviewed results: 06/20/23: Vit D: 7.20 IFE: no M protein   12/20/22: B1 < 6 HbA1c: 5.7 IFE: A poorly-defined area of restricted protein mobility is detected  and is reactive with IgA and lambda antisera.    12/04/22: CBC significant for MCV of 109.2 (chronically elevated)   Normal or unremarkable: TSH BMP significant for mildly elevated glucose LFTs mildly elevated (AST > ALT) CBC significant for MCV of 111.3; platelets 141 B12 (05/18/22): 1464   MRI brain and cervical spine w/wo contrast (10/05/22): FINDINGS: MRI HEAD FINDINGS   Brain: No acute infarction, hemorrhage, hydrocephalus, extra-axial collection or mass lesion. No white matter disease. No pathologic enhancement.   Vascular: Major arterial voids are maintained at the skull base.   Skull and upper cervical spine: Normal marrow signal.   Sinuses/Orbits: Minimal paranasal sinus mucosal thickening. No acute orbital findings.   Other: No mastoid effusions.   MRI CERVICAL SPINE FINDINGS   Alignment: Mild reversal the normal cervical lordosis. No substantial sagittal subluxation.  Vertebrae: Vertebral body heights are maintained. No marrow edema to suggest acute fracture or discitis/osteomyelitis. No suspicious bone lesions   Cord: Normal cord signal.  No abnormal cord enhancement.   Posterior Fossa, vertebral arteries, paraspinal tissues: Visualized vertebral artery flow voids are maintained.   Disc levels: Motion limited without evidence of significant disc protrusion, foraminal stenosis, or canal stenosis.   IMPRESSION: 1. Normal brain MRI.  No evidence of acute intracranial abnormality. 2. Unremarkable MRI of  the cervical spine. No significant stenosis and normal cord signal.   MRI lumbar spine wo contrast (08/20/22): FINDINGS: Segmentation: Standard. Lowest well-formed disc space labeled the L5-S1 level.   Alignment: Physiologic with preservation of the normal lumbar lordosis. No listhesis.   Vertebrae: Vertebral body height maintained without acute or chronic fracture. Bone marrow signal intensity diffusely decreased on T1 weighted sequence, nonspecific, but most commonly related to anemia, smoking or obesity. No discrete or worrisome osseous lesions. Mild reactive marrow edema present about the L4-5 facets due to facet arthritis. Mild reactive endplate change noted about the T11-12 interspace anteriorly.   Conus medullaris and cauda equina: Conus extends to the L1 level. Conus and cauda equina appear normal.   Paraspinal and other soft tissues: Unremarkable.   Disc levels:   L1-2:  Unremarkable.   L2-3:  Unremarkable.   L3-4: Normal interspace. Mild right greater than left facet hypertrophy with associated trace joint effusions. No spinal stenosis. Foramina remain patent.   L4-5: Normal interspace. Moderate bilateral facet arthrosis with associated mild reactive marrow edema and trace joint effusions. Mild epidural lipomatosis. No canal or foraminal stenosis.   L5-S1: Normal interspace. Moderate facet hypertrophy. Epidural lipomatosis. No canal or foraminal stenosis.   IMPRESSION: 1. Mild-to-moderate facet hypertrophy at L3-4 through L5-S1, most pronounced at L4-5 where there is associated mild reactive marrow edema. Finding could serve as a source for lower back pain. 2. Otherwise unremarkable MRI of the lumbar spine. No significant disc pathology, stenosis, or evidence for neural impingement.   MRI brain wo contrast (12/30/2015): FINDINGS: Axial diffusion only was performed. When accounting for superficial areas of susceptibility artifact, more prominent that unusual  in the setting of multi focal falx ossification, there is no evidence of acute infarct. No hydrocephalus or shift.   IMPRESSION: Diffusion only study that is negative for infarct or other acute finding.   MRI lumbar spine wo contrast (07/12/23): FINDINGS: Segmentation:  Standard.   Alignment:  Physiologic.   Vertebrae:  No fracture, evidence of discitis, or bone lesion.   Conus medullaris and cauda equina: Conus extends to the L1-L2 disc space level. Conus and cauda equina appear normal.   Paraspinal and other soft tissues: There is T2/stir hyperintense signal surrounding the bilateral facet joints at L4-L5 and L5-S1. These findings are most likely degenerative in nature.   Disc levels:   T12-L1: Mild bilateral facet degenerative change. No spinal canal or neural foraminal stenosis.   L1-L2: Mild bilateral facet degenerative change. No spinal canal or neural foraminal stenosis.   L2-L3: Mild bilateral facet degenerative change. No spinal canal or neural foraminal stenosis.   L3-L4: Mild bilateral facet degenerative change. No spinal canal or neural foraminal stenosis.   L4-L5: Severe bilateral facet degenerative change. Ligamentum flavum hypertrophy. No significant disc bulge. There is mild bony spinal canal narrowing. There is epidural lipomatosis that results in moderate narrowing of the thecal sac. Mild right neural foraminal narrowing.   L5-S1: Severe bilateral facet degenerative change. No significant disc bulge. No bony spinal canal  narrowing. No neural foraminal narrowing.   IMPRESSION: 1. No definite finding to explain bilateral upper and lower extremity numbness. Given history of chronic alcohol use, if there is concern for subacute combined degeneration / B12 deficiency, further evaluation with a cervical and thoracic spine MRI could be considered. 2. No evidence of high-grade bony spinal canal narrowing. 3. Severe bilateral facet degenerative change at  L4-L5 and L5-S1. 4. T2/STIR hyperintense signal surrounding the bilateral facet joints at L4-L5 and L5-S1, most likely degenerative in nature.   ASSESSMENT: This is Sonya Hurst, a 43 y.o. 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 She had low B1 (12/2022) and vit D which is likely contributing. She also has positive SSA and SSB antibodies, so a Sjogren's sensory neuronopathy could also be contributing. She reports no recent EtOH use, but she has not been taking B1 supplementation consistently. I have discussed EMG with patient in the past, but she had a bad experience in the past and is not interested in repeating. She has good pain control currently with gabapentin . She denies any recent falls.  Plan: -Blood work: HbA1c -B1 (thiamine ) 100 mcg daily -Continue vit D3 supplementation -Continue gabapentin  300/300/300/600 mg -Discussed importance of not drinking alcohol -Discussed foot care/inspection -Fall precautions discussed  Return to clinic in 6 months  Total time spent reviewing records, interview, history/exam, documentation, and coordination of care on day of encounter:  30 min  Venetia Potters, MD

## 2024-10-16 ENCOUNTER — Ambulatory Visit (HOSPITAL_BASED_OUTPATIENT_CLINIC_OR_DEPARTMENT_OTHER): Attending: Pulmonary Disease | Admitting: Internal Medicine

## 2024-10-17 ENCOUNTER — Ambulatory Visit: Payer: Self-pay | Admitting: Pulmonary Disease

## 2024-10-17 DIAGNOSIS — G4733 Obstructive sleep apnea (adult) (pediatric): Secondary | ICD-10-CM | POA: Diagnosis not present

## 2024-10-23 ENCOUNTER — Encounter: Payer: Self-pay | Admitting: Neurology

## 2024-10-23 ENCOUNTER — Other Ambulatory Visit

## 2024-10-23 ENCOUNTER — Ambulatory Visit: Admitting: Neurology

## 2024-10-23 VITALS — BP 129/86 | HR 75 | Ht 64.0 in | Wt 297.0 lb

## 2024-10-23 DIAGNOSIS — G621 Alcoholic polyneuropathy: Secondary | ICD-10-CM

## 2024-10-23 DIAGNOSIS — M545 Low back pain, unspecified: Secondary | ICD-10-CM

## 2024-10-23 DIAGNOSIS — R202 Paresthesia of skin: Secondary | ICD-10-CM

## 2024-10-23 DIAGNOSIS — R200 Anesthesia of skin: Secondary | ICD-10-CM

## 2024-10-23 DIAGNOSIS — E559 Vitamin D deficiency, unspecified: Secondary | ICD-10-CM | POA: Diagnosis not present

## 2024-10-23 DIAGNOSIS — E519 Thiamine deficiency, unspecified: Secondary | ICD-10-CM

## 2024-10-23 DIAGNOSIS — R7303 Prediabetes: Secondary | ICD-10-CM | POA: Diagnosis not present

## 2024-10-23 MED ORDER — GABAPENTIN 300 MG PO CAPS: ORAL_CAPSULE | ORAL | 11 refills | Status: AC

## 2024-10-23 NOTE — Patient Instructions (Signed)
 -B1 (thiamine ) 100 mcg every day  -Continue vit D3 supplementation  -Continue gabapentin  300/300/300/600 mg. I sent a new prescription to your pharmacy.  -Discussed importance of not drinking alcohol  -Discussed foot care/inspection  The physicians and staff at [facility] are committed to providing excellent care. You may receive a survey requesting feedback about your experience at our office. We strive to receive very good responses to the survey questions. If you feel that your experience would prevent you from giving the office a very good  response, please contact our office to try to remedy the situation. We may be reached at [phone]. Thank you for taking the time out of your busy day to complete the survey.  Venetia Potters, MD [facility]  Preventing Falls at [facility] are common, often dreaded events in the lives of older people. Aside from the obvious injuries and even death that may result, fall can cause wide-ranging consequences including loss of independence, mental decline, decreased activity and mobility. Younger people are also at risk of falling, especially those with chronic illnesses and fatigue.  Ways to reduce risk for falling Examine diet and medications. Warm foods and alcohol dilate blood vessels, which can lead to dizziness when standing. Sleep aids, antidepressants and pain medications can also increase the likelihood of a fall.  Get a vision exam. Poor vision, cataracts and glaucoma increase the chances of falling.  Check foot gear. Shoes should fit snugly and have a sturdy, nonskid sole and a broad, low heel  Participate in a physician-approved exercise program to build and maintain muscle strength and improve balance and coordination. Programs that use ankle weights or stretch bands are excellent for muscle-strengthening. Water aerobics programs and low-impact Tai Chi programs have also been shown to improve balance and  coordination.  Increase vitamin D  intake. Vitamin D  improves muscle strength and increases the amount of calcium  the body is able to absorb and deposit in bones.  How to prevent falls from common hazards Floors - Remove all loose wires, cords, and throw rugs. Minimize clutter. Make sure rugs are anchored and smooth. Keep furniture in its usual place.  Chairs -- Use chairs with straight backs, armrests and firm seats. Add firm cushions to existing pieces to add height.  Bathroom - Install grab bars and non-skid tape in the tub or shower. Use a bathtub transfer bench or a shower chair with a back support Use an elevated toilet seat and/or safety rails to assist standing from a low surface. Do not use towel racks or bathroom tissue holders to help you stand.  Lighting - Make sure halls, stairways, and entrances are well-lit. Install a night light in your bathroom or hallway. Make sure there is a light switch at the top and bottom of the staircase. Turn lights on if you get up in the middle of the night. Make sure lamps or light switches are within reach of the bed if you have to get up during the night.  Kitchen - Install non-skid rubber mats near the sink and stove. Clean spills immediately. Store frequently used utensils, pots, pans between waist and eye level. This helps prevent reaching and bending. Sit when getting things out of lower cupboards.  Living room/ Bedrooms - Place furniture with wide spaces in between, giving enough room to move around. Establish a route through the living room that gives you something to hold onto as you walk.  Stairs - Make sure treads, rails, and rugs are secure. Install a  rail on both sides of the stairs. If stairs are a threat, it might be helpful to arrange most of your activities on the lower level to reduce the number of times you must climb the stairs.  Entrances and doorways - Install metal handles on the walls adjacent to the doorknobs of all doors to make  it more secure as you travel through the doorway.  Tips for maintaining balance Keep at least one hand free at all times. Try using a backpack or fanny pack to hold things rather than carrying them in your hands. Never carry objects in both hands when walking as this interferes with keeping your balance.  Attempt to swing both arms from front to back while walking. This might require a conscious effort if Parkinson's disease has diminished your movement. It will, however, help you to maintain balance and posture, and reduce fatigue.  Consciously lift your feet off of the ground when walking. Shuffling and dragging of the feet is a common culprit in losing your balance.  When trying to navigate turns, use a U technique of facing forward and making a wide turn, rather than pivoting sharply.  Try to stand with your feet shoulder-length apart. When your feet are close together for any length of time, you increase your risk of losing your balance and falling.  Do one thing at a time. Don't try to walk and accomplish another task, such as reading or looking around. The decrease in your automatic reflexes complicates motor function, so the less distraction, the better.  Do not wear rubber or gripping soled shoes, they might catch on the floor and cause tripping.  Move slowly when changing positions. Use deliberate, concentrated movements and, if needed, use a grab bar or walking aid. Count 15 seconds between each movement. For example, when rising from a seated position, wait 15 seconds after standing to begin walking.  If balance is a continuous problem, you might want to consider a walking aid such as a cane, walking stick, or walker. Once you've mastered walking with help, you might be ready to try it on your own again.

## 2024-10-24 ENCOUNTER — Ambulatory Visit: Payer: Self-pay | Admitting: Neurology

## 2024-10-24 LAB — HEMOGLOBIN A1C
Hgb A1c MFr Bld: 4.7 % (ref <5.7)
Mean Plasma Glucose: 88 mg/dL
eAG (mmol/L): 4.9 mmol/L

## 2024-10-29 ENCOUNTER — Encounter: Payer: Self-pay | Admitting: Sleep Medicine

## 2024-10-29 ENCOUNTER — Other Ambulatory Visit: Payer: Self-pay | Admitting: Sleep Medicine

## 2024-10-29 ENCOUNTER — Ambulatory Visit: Admitting: Sleep Medicine

## 2024-10-29 VITALS — BP 118/76 | HR 100 | Ht 64.0 in | Wt 294.0 lb

## 2024-10-29 DIAGNOSIS — I10 Essential (primary) hypertension: Secondary | ICD-10-CM

## 2024-10-29 DIAGNOSIS — Z6841 Body Mass Index (BMI) 40.0 and over, adult: Secondary | ICD-10-CM

## 2024-10-29 DIAGNOSIS — G4733 Obstructive sleep apnea (adult) (pediatric): Secondary | ICD-10-CM | POA: Diagnosis not present

## 2024-10-29 DIAGNOSIS — F17210 Nicotine dependence, cigarettes, uncomplicated: Secondary | ICD-10-CM | POA: Diagnosis not present

## 2024-10-29 MED ORDER — ZEPBOUND 2.5 MG/0.5ML ~~LOC~~ SOAJ: 2.5000 mg | SUBCUTANEOUS | 1 refills | Status: AC

## 2024-10-29 NOTE — Patient Instructions (Addendum)

## 2024-10-29 NOTE — Progress Notes (Signed)
 Name:Sonya Hurst MRN: [id number] DOB: Nov 26, 1981   CHIEF COMPLAINT:  EXCESSIVE DAYTIME SLEEPINESS   HISTORY OF PRESENT ILLNESS: Sonya Hurst is a 43 y.o. w/ a h/o HTN, GERD and morbid obesity who presents for c/o intermittent snoring and excessive daytime sleepiness which has been present for several years. Reports nocturnal awakenings due to her child or due to nocturia, however does not have difficulty falling back to sleep. Reports a 70 lb weight loss. Admits to night sweats and dry mouth. Denies morning headaches, RLS symptoms, dream enactment, cataplexy, hypnagogic or hypnapompic hallucinations. Reports a family history of sleep apnea. Denies drowsy driving. Occasional alcohol use, smokes 5 cigarettes daily, denies illicit drug use.   The patient underwent HST which revealed mild OSA (AHI 11, O2 nadir 79%).   Bedtime 8-9 pm Sleep onset 10 mins Rise time 7 am   EPWORTH SLEEP SCORE 13    08/27/2024   10:53 AM  Results of the Epworth flowsheet  Sitting and reading 3  Watching TV 1  Sitting, inactive in a public place (e.g. a theatre or a meeting) 1  As a passenger in a car for an hour without a break 2  Lying down to rest in the afternoon when circumstances permit 3  Sitting and talking to someone 1  Sitting quietly after a lunch without alcohol 2  In a car, while stopped for a few minutes in traffic 0  Total score 13    PAST MEDICAL HISTORY :   has a past medical history of Alcohol abuse, BMI 50.0-59.9, adult (HCC), Cigarette nicotine dependence with other nicotine-induced disorder, Depression, Exertional dyspnea, Gait disorder, GERD (gastroesophageal reflux disease), History of DVT (deep vein thrombosis), Hypertension, Insomnia, Migraines, Morbid obesity (HCC), Multiple falls, Neuropathy, Neuropathy, Obesity, and Urinary incontinence.  has a past surgical history that includes Essure tubal ligation (2008); Finger fracture surgery; and Cholecystectomy (N/A,  05/09/2024). Prior to Admission medications   Medication Sig Start Date End Date Taking? Authorizing Provider  albuterol  (VENTOLIN  HFA) 108 (90 Base) MCG/ACT inhaler Inhale 2 puffs into the lungs every 6 (six) hours as needed. 08/27/24  Yes Tamea Dedra CROME, MD  budesonide -formoterol  (SYMBICORT ) 160-4.5 MCG/ACT inhaler Inhale 2 puffs into the lungs 2 (two) times daily. 08/27/24  Yes Tamea Dedra CROME, MD  cetirizine (ZYRTEC) 10 MG tablet Take 10 mg by mouth daily.   Yes [provider]  fluticasone  (FLONASE ) 50 MCG/ACT nasal spray Place 2 sprays into both nostrils daily.   Yes [provider]  furosemide  (LASIX ) 40 MG tablet Take 40 mg by mouth daily. 11/25/23  Yes [provider]  gabapentin  (NEURONTIN ) 300 MG capsule Take 1 capsule (300 mg) by mouth in the morning, 1 capsule (300 mg) midday, 1 capsule (300 mg) in the afternoon, and take 2 capsules (600 mg) by mouth at bedtime. 10/23/24  Yes Leigh Venetia CROME, MD  lisinopril  (ZESTRIL ) 30 MG tablet Take 30 mg by mouth daily. 11/27/23  Yes [provider]  methocarbamol  (ROBAXIN ) 750 MG tablet Take 1 tablet (750 mg total) by mouth every 8 (eight) hours as needed. 05/10/24  Yes Drusilla Sabas RAMAN, MD  omeprazole (PRILOSEC) 40 MG capsule Take 40 mg by mouth daily.   Yes [provider]  Potassium Chloride  40 MEQ/15ML (20%) SOLN daily. 03/04/24  Yes [provider]  Spacer/Aero-Holding Chambers (VORTEX VALVED HOLDING CHAMBER) DEVI Use as directed with inhalers. 08/27/24  Yes Tamea Dedra CROME, MD  thiamine  (VITAMIN B1) 100 MG  tablet Take 1 tablet (100 mg total) by mouth daily. 12/21/23  Yes Leigh Venetia CROME, MD  traZODone (DESYREL) 100 MG tablet Take 100 mg by mouth at bedtime as needed. 02/13/24  Yes [provider]  WEGOVY 0.25 MG/0.5ML SOAJ Inject 0.25 mg into the skin once a week. 12/17/23  Yes [provider]  ondansetron  (ZOFRAN ) 4 MG tablet Take 1 tablet (4 mg total) by mouth every 6 (six)  hours. Patient not taking: Reported on 10/29/2024 04/15/24   Myriam Dorn BROCKS, PA  oxyCODONE -acetaminophen  (PERCOCET) 5-325 MG tablet Take 1 tablet by mouth every 6 (six) hours as needed for severe pain (pain score 7-10) or moderate pain (pain score 4-6). Can increase to 2 pills at a time as needed Patient not taking: Reported on 10/29/2024 05/09/24   Sheldon Standing, MD   Allergies  Allergen Reactions   Olopatadine Hcl Swelling    Ophthalmic swelling  Other Reaction(s): eyes swell    FAMILY HISTORY:  family history includes Sleep apnea in her father. SOCIAL HISTORY:  reports that she has been smoking cigarettes. She started smoking about 26 years ago. She has a 6.7 pack-year smoking history. She has never been exposed to tobacco smoke. She has never used smokeless tobacco. She reports current alcohol use. She reports that she does not use drugs.   Review of Systems:  Gen:  Denies  fever, sweats, chills weight loss  HEENT: Denies blurred vision, double vision, ear pain, eye pain, hearing loss, nose bleeds, sore throat Cardiac:  No dizziness, chest pain or heaviness, chest tightness,edema, No JVD Resp:   No cough, -sputum production, -shortness of breath,-wheezing, -hemoptysis,  Gi: Denies swallowing difficulty, stomach pain, nausea or vomiting, diarrhea, constipation, bowel incontinence Gu:  Denies bladder incontinence, burning urine Ext:   Denies Joint pain, stiffness or swelling Skin: Denies  skin rash, easy bruising or bleeding or hives Endoc:  Denies polyuria, polydipsia , polyphagia or weight change Psych:   Denies depression, insomnia or hallucinations  Other:  All other systems negative  VITAL SIGNS: BP 118/76   Pulse 100   Ht 5' 4 (1.626 m)   Wt 294 lb (133.4 kg)   SpO2 97%   BMI 50.46 kg/m    Physical Examination:   General Appearance: No distress  EYES PERRLA, EOM intact.   NECK Supple, No JVD Pulmonary: normal breath sounds, No wheezing.   CardiovascularNormal S1,S2.  No m/r/g.   Abdomen: Benign, Soft, non-tender. Skin:   warm, no rashes, no ecchymosis  Extremities: normal, no cyanosis, clubbing. Neuro:without focal findings,  speech normal  PSYCHIATRIC: Mood, affect within normal limits.   ASSESSMENT AND PLAN  OSA Reviewed HST results with patient. Starting on APAP therapy set to 4-16 cm H2O. Discussed the consequences of untreated sleep apnea. Advised not to drive drowsy for safety of patient and others. Will follow up in 3 months.     HTN Stable, on current management. Following with PCP.   Morbid obesity Counseled patient on diet and lifestyle modification. Will also try patient on Zepbound and titrate as tolerated. Denies a personal or family history of thyroid medullary CA or pancreatitis.    Patient  satisfied with Plan of action and management. All questions answered  I spent a total of 53 minutes reviewing chart data, face-to-face evaluation with the patient, counseling and coordination of care as detailed above.    Izsak Meir, M.D.  Sleep Medicine  Pulmonary & Critical Care Medicine

## 2024-10-30 ENCOUNTER — Telehealth: Payer: Self-pay

## 2024-10-30 NOTE — Telephone Encounter (Signed)
*  Pulm  Pharmacy Patient Advocate Encounter   Received notification from RX Request Messages that prior authorization for ZEPBOUND 2.5 MG/0.5ML Pen  is required/requested.   Insurance verification completed.   The patient is insured through [employer] MEDICAID/[employer]   Per test claim: Effective October 1st, Medicaid discontinued coverage of GLP1 medications for weight loss (such as Wegovy and Zepbound), unless the patient has a documented history of a heart attack or stroke. Zepbound will continue to be covered only for patients with moderate to severe sleep apnea (AHI 15-30) and a BMI greater than 40. Because of this change, the prior authorization team will not be submitting new PA requests for GLP1 medications prescribed for weight loss, as patients will be unable to continue therapy under Medicaid coverage.  *per sleep study  10/09/2024 [facility] OSA

## 2024-12-09 NOTE — Progress Notes (Addendum)
 "  Office Visit Note  Patient: Sonya Hurst             Date of Birth: [birth year]           MRN: [id number]             PCP: Cleotilde, [place]  E, PA Referring: Alverda Mardy SAUNDERS, MD Visit Date: 12/15/2024 Occupation: Data Unavailable  Subjective:  New Patient (Initial Visit) (Establish care )   Discussed the use of AI scribe software for clinical note transcription with the patient, who gave verbal consent to proceed.  History of Present Illness Sonya Hurst is a 43 year old female with Sjogren's syndrome who presents with worsening neuropathy and hair loss. She was referred by Dr. Strazanac for evaluation of her ongoing symptoms related to Sjogren's syndrome.  Her symptoms began on May 23, 2022, with severe pain in her feet and hands, making ambulation difficult. She developed a blood clot in her leg in July 2023, leading to persistent swelling in the affected leg, which worsens with activity and is relieved by rest and wearing compression socks, although they cause discomfort. She experiences burning and tingling nerve pain. Gabapentin  provides some relief if taken before the onset of symptoms. Methocarbamol , which she ran out of, was effective in alleviating her nerve pain. Her leg pain is more pronounced in the leg that had the blood clot, and it swells more than the other leg.  She has been experiencing dry mouth and dry eyes. She also reports dragging her left leg. Stiffness and pain in her fingers, especially in the morning, improve after running hot water over them. The stiffness lasts about 15 to 30 minutes. Her fingers hurt when trying to perform tasks.  Her hair loss began in August 2023, and she has documented the extent of the loss with photographs. She developed a rash on her scalp in March 2025, which sometimes appears under her breasts and lasts for a few days. The rash on her scalp improves after washing her hair.  She has a history of frequent urination and urge  incontinence, which has not improved significantly. She has not seen a gynecologist or urologist for these symptoms.  She consumes alcohol, approximately one to two 16-ounce beers per day, and sometimes more depending on stressors. She has reduced her alcohol intake recently. She has a history of a DVT, which she attributes to prolonged bed rest and use of Depo-Provera. She was on Eliquis  but was instructed to stop taking it in June 2025.    Activities of Daily Living:  Patient reports morning stiffness for 30 minutes.   Patient Denies nocturnal pain.  Difficulty dressing/grooming: Reports Difficulty climbing stairs: Reports Difficulty getting out of chair: Reports Difficulty using hands for taps, buttons, cutlery, and/or writing: Reports  Review of Systems  Constitutional:  Positive for fatigue.  HENT:  Positive for mouth dryness. Negative for mouth sores.   Eyes:  Positive for dryness.  Respiratory:  Positive for shortness of breath.   Cardiovascular:  Positive for swelling in legs/feet. Negative for chest pain and palpitations.  Gastrointestinal:  Positive for diarrhea. Negative for blood in stool and constipation.  Endocrine: Negative for increased urination.  Genitourinary:  Positive for involuntary urination.  Musculoskeletal:  Positive for gait problem, myalgias, muscle weakness, morning stiffness and myalgias. Negative for joint pain, joint pain, joint swelling and muscle tenderness.  Skin:  Positive for hair loss. Negative for color change, rash and sensitivity to sunlight.  Allergic/Immunologic: Positive for  susceptible to infections.  Neurological:  Positive for headaches. Negative for dizziness.  Hematological:  Negative for swollen glands.  Psychiatric/Behavioral:  Positive for depressed mood and sleep disturbance. The patient is not nervous/anxious.     PMFS History:  Patient Active Problem List   Diagnosis Date Noted   Intractable vomiting 05/08/2024   Right sided  weakness 01/03/2016   Dysarthria 01/03/2016    Past Medical History:  Diagnosis Date   Alcohol abuse    BMI 50.0-59.9, adult (HCC)    Cigarette nicotine dependence with other nicotine-induced disorder    Depression    Exertional dyspnea    Gait disorder    GERD (gastroesophageal reflux disease)    History of DVT (deep vein thrombosis)    Hypertension    Insomnia    Migraines    Morbid obesity (HCC)    Multiple falls    Neuropathy    Neuropathy    Obesity    Sleep apnea    Urinary incontinence     Family History  Problem Relation Age of Onset   Cancer Mother    Sleep apnea Father    ADD / ADHD Son    Other Son        CMV   Obesity Daughter    Past Surgical History:  Procedure Laterality Date   CHOLECYSTECTOMY N/A 05/09/2024   Procedure: LAPAROSCOPIC CHOLECYSTECTOMY WITH INTRAOPERATIVE CHOLANGIOGRAM;  Surgeon: Sheldon Standing, MD;  Location: [facility];  Service: General;  Laterality: N/A;  Possible intraoperative cholangiogram   ESSURE TUBAL LIGATION  2008   FINGER FRACTURE SURGERY     Social History[1] Social History   Social History Narrative   Denies caffeine use.   Are you right handed or left handed? Right   Are you currently employed ?    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   Who lives with you? family   What type of home do you live in: 1 story or 2 story?     No caffeine     Immunization History  Administered Date(s) Administered   Influenza-Unspecified 12/16/2018     Objective: Vital Signs: BP (!) 137/97 (BP Location: Left Arm, Patient Position: Sitting, Cuff Size: Normal)   Pulse (!) 47   Temp (!) 97.2 F (36.2 C)   Resp 12   Ht 5' 5 (1.651 m)   Wt (!) 300 lb 6.4 oz (136.3 kg)   BMI 49.99 kg/m    Physical Exam Vitals and nursing note reviewed.  HENT:     Head: Normocephalic and atraumatic.     Nose: Nose normal.  Eyes:     Conjunctiva/sclera: Conjunctivae normal.     Pupils: Pupils are  equal, round, and reactive to light.  Pulmonary:     Effort: Pulmonary effort is normal. No respiratory distress.  Skin:    General: Skin is warm and dry.  Neurological:     Mental Status: She is alert. Mental status is at baseline.     Motor: No weakness (5/5 proximal UE and LE b/l).     Gait: Gait abnormal (left foot drop).  Psychiatric:        Mood and Affect: Mood normal.        Behavior: Behavior normal.      Musculoskeletal Exam:   CDAI Exam: CDAI Score: -- Patient Global: --; Provider Global: -- Swollen: 0 ; Tender: 0  Joint Exam 12/15/2024   All documented joints were normal  Investigation: No additional findings.  Imaging: No results found.  Recent Labs: Lab Results  Component Value Date   WBC 12.8 (H) 05/10/2024   HGB 11.3 (L) 05/10/2024   PLT 138 (L) 05/10/2024   NA 140 05/10/2024   K 3.6 05/10/2024   CL 105 05/10/2024   CO2 25 05/10/2024   GLUCOSE 101 (H) 05/10/2024   BUN 7 05/10/2024   CREATININE 0.83 05/10/2024   BILITOT 0.7 05/10/2024   ALKPHOS 133 (H) 05/10/2024   AST 184 (H) 05/10/2024   ALT 55 (H) 05/10/2024   PROT 6.0 (L) 05/10/2024   ALBUMIN 2.4 (L) 05/10/2024   CALCIUM  8.2 (L) 05/10/2024   GFRAA >60 01/06/2019    Speciality Comments: No specialty comments available.  Procedures:  No procedures performed Allergies: Olopatadine hcl   Assessment / Plan:     Visit Diagnoses:  Positive ANA (antinuclear antibody) Sicca syndrome Patient with hx of positive ANA w/ mildly elevated SSA and SSB w/ sicca symptoms; meeting criteria for Sjogren's syndrome. She also has a rash on her scalp w/ patchy hair loss of unclear etiology. Although patchy hair loss is typically seen with SLE, serologies have been negative on multiple recent laboratory studies. Would typically trial HCQ to see if this improved this manifestation, however she has a Qtc mildly prolonged at 490.   Given prednisone taper has not been tried in the past, will provide  patient with prednisone taper to see if this improves her neuropathy, discussed below, as well as her cutaneous manifestations.    As discussed with patient, the difficulty remains to find a reasonable steroid sparing agent if prednisone does provide benefit (elevated liver enzymes, heavy alcohol use, hx of unprovoked DVT). Further recommendations pending response to prednisone.  For Sjogren's syndrome, will check K/L ratio, SPEP, IFE annually given risk of lymphoproliferative disease in this patient population.  Neuropathy  Patient with neuropathy of unclear etiology. Patient refuses EMG for evaluation, making diagnosis complicated. Could be secondary to sjogren's as discussed above. However, I did find that her EKG showed low voltage QRS, which along w/ her neuropathy, could possibly suggest a diagnosis of amyloidosis? Will obtain IFE Interpretation, Protein Electrophoresis, (serum), Kappa/lambda light chains, Protein / creatinine ratio, urine   Orders: Orders Placed This Encounter  Procedures   IFE Interpretation   Protein Electrophoresis, (serum)   Kappa/lambda light chains   Protein / creatinine ratio, urine   Meds ordered this encounter  Medications   predniSONE (DELTASONE) 20 MG tablet    Sig: Take 2 tablets (40 mg total) by mouth daily with breakfast for 5 days, THEN 1.5 tablets (30 mg total) daily with breakfast for 5 days, THEN 1 tablet (20 mg total) daily with breakfast for 5 days, THEN 0.5 tablets (10 mg total) daily with breakfast for 5 days.    Dispense:  25 tablet    Refill:  0   I personally spent a total of 60 minutes in the care of the patient today including preparing to see the patient, getting/reviewing separately obtained history, performing a medically appropriate exam/evaluation, counseling and educating, placing orders, documenting clinical information in the EHR, independently interpreting results, and communicating results.   Follow-Up Instructions: Return in  about 4 weeks (around 01/12/2025).   Asberry Claw, DO  Note - This record has been created using Animal nutritionist.  Chart creation errors have been sought, but may not always  have been located. Such creation errors do not reflect on  the standard of medical care.     [  1]  Social History Tobacco Use   Smoking status: Every Day    Current packs/day: 0.25    Average packs/day: 0.3 packs/day for 27.0 years (6.7 ttl pk-yrs)    Types: Cigarettes    Start date: 1999    Passive exposure: Never   Smokeless tobacco: Never   Tobacco comments:    Smoke about 5 per day.  Vaping Use   Vaping status: Never Used  Substance Use Topics   Alcohol use: Yes    Alcohol/week: 0.0 standard drinks of alcohol   Drug use: No   "

## 2024-12-15 ENCOUNTER — Ambulatory Visit

## 2024-12-15 VITALS — BP 137/97 | HR 47 | Temp 97.2°F | Resp 12 | Ht 65.0 in | Wt 300.4 lb

## 2024-12-15 DIAGNOSIS — G629 Polyneuropathy, unspecified: Secondary | ICD-10-CM | POA: Insufficient documentation

## 2024-12-15 DIAGNOSIS — R7689 Other specified abnormal immunological findings in serum: Secondary | ICD-10-CM | POA: Insufficient documentation

## 2024-12-15 DIAGNOSIS — M3500 Sicca syndrome, unspecified: Secondary | ICD-10-CM | POA: Diagnosis not present

## 2024-12-15 MED ORDER — PREDNISONE 20 MG PO TABS: ORAL_TABLET | ORAL | 0 refills | Status: AC

## 2024-12-18 LAB — KAPPA/LAMBDA LIGHT CHAINS
Kappa free light chain: 50.4 mg/L — ABNORMAL HIGH (ref 3.3–19.4)
Kappa:Lambda Ratio: 1.49 (ref 0.26–1.65)
Lambda Free Lght Chn: 33.9 mg/L — ABNORMAL HIGH (ref 5.7–26.3)

## 2024-12-18 LAB — PROTEIN ELECTROPHORESIS, SERUM
Albumin ELP: 3.2 g/dL — ABNORMAL LOW (ref 3.8–4.8)
Alpha 1: 0.3 g/dL (ref 0.2–0.3)
Alpha 2: 0.6 g/dL (ref 0.5–0.9)
Beta 2: 0.6 g/dL — ABNORMAL HIGH (ref 0.2–0.5)
Beta Globulin: 0.4 g/dL (ref 0.4–0.6)
Gamma Globulin: 1.7 g/dL (ref 0.8–1.7)
Total Protein: 6.8 g/dL (ref 6.1–8.1)

## 2024-12-18 LAB — PROTEIN / CREATININE RATIO, URINE
Creatinine, Urine: 86 mg/dL (ref 20–275)
Protein/Creat Ratio: 326 mg/g{creat} — ABNORMAL HIGH (ref 24–184)
Protein/Creatinine Ratio: 0.326 mg/mg{creat} — ABNORMAL HIGH (ref 0.024–0.184)
Total Protein, Urine: 28 mg/dL — ABNORMAL HIGH (ref 5–24)

## 2024-12-18 LAB — IFE INTERPRETATION

## 2024-12-24 ENCOUNTER — Telehealth: Payer: Self-pay | Admitting: *Deleted

## 2024-12-24 NOTE — Telephone Encounter (Signed)
 Patient states she feels the Prednisone  40 mg was to strong patient states she had a headache for 4 days straight. Patient states she knows that you advised her to avoid taking Ibuprofen  but she states she waited about 5 hours and then took some because she had been suffering for days. Patient states her last dose she took of the prednisone  was Monday. Patient would like to know how you would like her to proceed.

## 2024-12-24 NOTE — Telephone Encounter (Signed)
 Patient contacted the office regarding in her lab results. Patient advised her labs just finished resulting and Dr. Luba has not not had time to review them yet. Please review and advise.

## 2024-12-26 NOTE — Telephone Encounter (Signed)
 Per Dr. Luba okay to schedule patient a sooner appointment to discuss results. Patient scheduled for 12/30/2024 to discuss.

## 2024-12-30 ENCOUNTER — Ambulatory Visit

## 2024-12-30 VITALS — BP 138/95 | HR 81 | Temp 97.3°F | Resp 16 | Ht 65.0 in | Wt 293.8 lb

## 2024-12-30 DIAGNOSIS — F1010 Alcohol abuse, uncomplicated: Secondary | ICD-10-CM | POA: Insufficient documentation

## 2024-12-30 DIAGNOSIS — L659 Nonscarring hair loss, unspecified: Secondary | ICD-10-CM | POA: Diagnosis not present

## 2024-12-30 DIAGNOSIS — M3500 Sicca syndrome, unspecified: Secondary | ICD-10-CM | POA: Diagnosis not present

## 2024-12-30 DIAGNOSIS — R7689 Other specified abnormal immunological findings in serum: Secondary | ICD-10-CM | POA: Insufficient documentation

## 2024-12-30 DIAGNOSIS — G629 Polyneuropathy, unspecified: Secondary | ICD-10-CM | POA: Diagnosis not present

## 2024-12-30 DIAGNOSIS — R778 Other specified abnormalities of plasma proteins: Secondary | ICD-10-CM | POA: Insufficient documentation

## 2024-12-30 NOTE — Progress Notes (Signed)
 "  Office Visit Note  Patient: Sonya Hurst             Date of Birth: 11-Mar-1981           MRN: [id number]             PCP: Cleotilde Reena BRAVO, PA Referring: Cleotilde Izell BRAVO, PA Visit Date: 12/30/2024 Occupation: Data Unavailable  Subjective:  Medical Management of Chronic Issues (Discuss prednisone  dose an it causing headaches)  Discussed the use of AI scribe software for clinical note transcription with the patient, who gave verbal consent to proceed.  History of Present Illness Sonya Hurst is a 44 year old female with Sjogren's syndrome who presents for abnormal lab results.  She experiences neuropathy symptoms, including numbness and tingling, which worsen at night. Gabapentin , taken one pill in the morning, one at noon, and three at night, helps alleviate the symptoms and allows her to sleep. Prednisone  improved her ability to walk but did not significantly improve the numbness and tingling.  She reports hair loss since August, with hair falling out when touched. She also experiences itching without a visible rash. She takes thiamine  occasionally but struggles with eating regularly.  She has a history of cirrhosis and is unsure about dietary restrictions related to this condition. She avoids certain foods like Chinese food and is concerned about what she can safely eat.  She noticed blood in her stool once, but it has not since re-occurred.  She denies taking the prednisone  as directed, stating she only took 3 days of the medicine due to it causing a headache. She notes she could walk a [place] better when she took it but did not note any other changes in her symptoms.    Activities of Daily Living:  Patient reports morning stiffness for 15 minutes.   Patient Reports nocturnal pain.  Difficulty dressing/grooming: Reports Difficulty climbing stairs: Reports Difficulty getting out of chair: Reports Difficulty using hands for taps, buttons, cutlery, and/or writing:  Reports  Review of Systems  Constitutional:  Positive for fatigue.  HENT:  Positive for mouth dryness. Negative for mouth sores.   Eyes:  Negative for dryness.  Respiratory:  Negative for shortness of breath.   Cardiovascular:  Negative for chest pain and palpitations.  Gastrointestinal:  Positive for blood in stool and diarrhea. Negative for constipation.  Endocrine: Positive for increased urination.  Genitourinary:  Negative for involuntary urination.  Musculoskeletal:  Positive for joint pain, gait problem, joint pain, muscle weakness and morning stiffness. Negative for joint swelling and muscle tenderness.  Skin:  Positive for hair loss. Negative for color change, rash and sensitivity to sunlight.  Allergic/Immunologic: Negative for susceptible to infections.  Neurological:  Positive for headaches. Negative for dizziness.  Hematological:  Negative for swollen glands.  Psychiatric/Behavioral:  Positive for sleep disturbance. Negative for depressed mood. The patient is not nervous/anxious.     PMFS History:  Patient Active Problem List   Diagnosis Date Noted   Intractable vomiting 05/08/2024   Right sided weakness 01/03/2016   Dysarthria 01/03/2016    Past Medical History:  Diagnosis Date   Alcohol abuse    BMI 50.0-59.9, adult (HCC)    Cigarette nicotine dependence with other nicotine-induced disorder    Depression    Exertional dyspnea    Gait disorder    GERD (gastroesophageal reflux disease)    History of DVT (deep vein thrombosis)    Hypertension    Insomnia    Migraines  Morbid obesity (HCC)    Multiple falls    Neuropathy    Neuropathy    Obesity    Sleep apnea    Urinary incontinence     Family History  Problem Relation Age of Onset   Cancer Mother    Sleep apnea Father    ADD / ADHD Son    Other Son        CMV   Obesity Daughter    Past Surgical History:  Procedure Laterality Date   CHOLECYSTECTOMY N/A 05/09/2024   Procedure: LAPAROSCOPIC  CHOLECYSTECTOMY WITH INTRAOPERATIVE CHOLANGIOGRAM;  Surgeon: Sheldon Standing, MD;  Location: [facility];  Service: General;  Laterality: N/A;  Possible intraoperative cholangiogram   ESSURE TUBAL LIGATION  2008   FINGER FRACTURE SURGERY     Social History[1] Social History   Social History Narrative   Denies caffeine use.   Are you right handed or left handed? Right   Are you currently employed ?    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   Who lives with you? family   What type of home do you live in: 1 story or 2 story?     No caffeine     Immunization History  Administered Date(s) Administered   Influenza-Unspecified 12/16/2018     Objective: Vital Signs: BP (!) 138/95   Pulse 81   Temp (!) 97.3 F (36.3 C)   Resp 16   Ht 5' 5 (1.651 m)   Wt 293 lb 12.8 oz (133.3 kg)   BMI 48.89 kg/m    Physical Exam Vitals and nursing note reviewed.  HENT:     Head: Normocephalic and atraumatic.     Comments: Patchy hair loss    Nose: Nose normal.  Eyes:     Conjunctiva/sclera: Conjunctivae normal.     Pupils: Pupils are equal, round, and reactive to light.  Pulmonary:     Effort: Pulmonary effort is normal. No respiratory distress.  Skin:    General: Skin is warm and dry.  Neurological:     Mental Status: She is alert. Mental status is at baseline.  Psychiatric:        Mood and Affect: Mood normal.        Behavior: Behavior normal.      Musculoskeletal Exam:   CDAI Exam: CDAI Score: -- Patient Global: --; Provider Global: -- Swollen: 0 ; Tender: 0  Joint Exam 12/30/2024   All documented joints were normal     Investigation: No additional findings.  Imaging: No results found.  Recent Labs: Lab Results  Component Value Date   WBC 12.8 (H) 05/10/2024   HGB 11.3 (L) 05/10/2024   PLT 138 (L) 05/10/2024   NA 140 05/10/2024   K 3.6 05/10/2024   CL 105 05/10/2024   CO2 25 05/10/2024   GLUCOSE 101 (H) 05/10/2024    BUN 7 05/10/2024   CREATININE 0.83 05/10/2024   BILITOT 0.7 05/10/2024   ALKPHOS 133 (H) 05/10/2024   AST 184 (H) 05/10/2024   ALT 55 (H) 05/10/2024   PROT 6.8 12/15/2024   ALBUMIN 2.4 (L) 05/10/2024   CALCIUM  8.2 (L) 05/10/2024   GFRAA >60 01/06/2019    Speciality Comments: No specialty comments available.  Procedures:  No procedures performed Allergies: Olopatadine hcl   Assessment / Plan:     Visit Diagnoses:  Positive ANA (antinuclear antibody) Sicca syndrome Patient with hx of positive ANA w/ mildly elevated SSA and SSB  w/ sicca symptoms; meeting criteria for Sjogren's syndrome. She also has a rash on her scalp w/ patchy hair loss of unclear etiology. Although patchy hair loss is typically seen with SLE, serologies have been negative on multiple recent laboratory studies. Would typically trial HCQ to see if this improved this manifestation, however she has a Qtc mildly prolonged at 490.   Given she could not tolerate high dose of prednisone , advised patient to try taper starting at a lower dose. Patient advised to take 20mg  x 7 days then 10mg  x 7 days to see if this provides improvement.  As discussed with patient, the difficulty remains to find a reasonable steroid sparing agent if prednisone  does provide benefit (elevated liver enzymes, heavy alcohol use, hx of unprovoked DVT). Further recommendations pending response to prednisone .   Neuropathy Abnormal SPEP  Low voltage QRS Concern for possible amyloidosis Plan: Ambulatory referral to Hematology / Oncology  Chronic alcohol abuse Hair loss Plan: B12 and Folate Panel, Vitamin B1  Orders: Orders Placed This Encounter  Procedures   B12 and Folate Panel   Vitamin B1   TSH + free T4   Ambulatory referral to Hematology / Oncology   No orders of the defined types were placed in this encounter.   I personally spent a total of 40 minutes in the care of the patient today including preparing to see the patient,  getting/reviewing separately obtained history, performing a medically appropriate exam/evaluation, counseling and educating, placing orders, referring and communicating with other health care professionals, documenting clinical information in the EHR, independently interpreting results, and communicating results.   Follow-Up Instructions: Return in about 2 months (around 02/27/2025).   Asberry Claw, DO  Note - This record has been created using Animal nutritionist.  Chart creation errors have been sought, but may not always  have been located. Such creation errors do not reflect on  the standard of medical care.     [1]  Social History Tobacco Use   Smoking status: Every Day    Current packs/day: 0.25    Average packs/day: 0.3 packs/day for 27.0 years (6.8 ttl pk-yrs)    Types: Cigarettes    Start date: 1999    Passive exposure: Never   Smokeless tobacco: Never   Tobacco comments:    Smoke about 5 per day.  Vaping Use   Vaping status: Never Used  Substance Use Topics   Alcohol use: Yes    Alcohol/week: 0.0 standard drinks of alcohol   Drug use: No   "

## 2024-12-30 NOTE — Patient Instructions (Signed)
 Take 1 tablet (20 mg total) daily with breakfast for 7 days THEN 0.5 tablets (10 mg total) daily with breakfast for 7 days.

## 2025-01-03 LAB — B12 AND FOLATE PANEL
Folate: 2.8 ng/mL — ABNORMAL LOW
Vitamin B-12: 462 pg/mL (ref 200–1100)

## 2025-01-03 LAB — TSH+FREE T4: TSH W/REFLEX TO FT4: 1.26 m[IU]/L

## 2025-01-03 LAB — VITAMIN B1: Vitamin B1 (Thiamine): <6 nmol/L — ABNORMAL LOW (ref 8–30)

## 2025-01-14 ENCOUNTER — Ambulatory Visit: Payer: Self-pay

## 2025-01-20 ENCOUNTER — Ambulatory Visit

## 2025-01-28 ENCOUNTER — Inpatient Hospital Stay: Admitting: Oncology

## 2025-01-28 ENCOUNTER — Inpatient Hospital Stay

## 2025-01-29 ENCOUNTER — Ambulatory Visit: Admitting: Sleep Medicine

## 2025-03-02 ENCOUNTER — Ambulatory Visit

## 2025-04-29 ENCOUNTER — Ambulatory Visit: Admitting: Neurology
